# Patient Record
Sex: Female | Born: 1945
Health system: Southern US, Community
[De-identification: ages and names within clinical notes are randomized; demographics above are authoritative.]

## PROBLEM LIST (undated history)

## (undated) DIAGNOSIS — C539 Malignant neoplasm of cervix uteri, unspecified: Secondary | ICD-10-CM

## (undated) DIAGNOSIS — R55 Syncope and collapse: Secondary | ICD-10-CM

## (undated) DIAGNOSIS — E538 Deficiency of other specified B group vitamins: Secondary | ICD-10-CM

## (undated) DIAGNOSIS — D649 Anemia, unspecified: Secondary | ICD-10-CM

## (undated) DIAGNOSIS — Z8541 Personal history of malignant neoplasm of cervix uteri: Secondary | ICD-10-CM

## (undated) DIAGNOSIS — M5430 Sciatica, unspecified side: Secondary | ICD-10-CM

## (undated) DIAGNOSIS — J45991 Cough variant asthma: Secondary | ICD-10-CM

## (undated) DIAGNOSIS — B1910 Unspecified viral hepatitis B without hepatic coma: Secondary | ICD-10-CM

## (undated) DIAGNOSIS — M791 Myalgia, unspecified site: Secondary | ICD-10-CM

## (undated) DIAGNOSIS — E559 Vitamin D deficiency, unspecified: Secondary | ICD-10-CM

## (undated) DIAGNOSIS — IMO0002 Reserved for concepts with insufficient information to code with codable children: Secondary | ICD-10-CM

## (undated) HISTORY — DX: Reserved for concepts with insufficient information to code with codable children: IMO0002

## (undated) HISTORY — DX: Sciatica, unspecified side: M54.30

## (undated) HISTORY — DX: Anemia, unspecified: D64.9

## (undated) HISTORY — DX: Unspecified viral hepatitis B without hepatic coma: B19.10

## (undated) HISTORY — DX: Personal history of malignant neoplasm of cervix uteri: Z85.41

## (undated) HISTORY — PX: ABDOMINAL HYSTERECTOMY: SHX81

## (undated) HISTORY — DX: Vitamin D deficiency, unspecified: E55.9

## (undated) HISTORY — DX: Myalgia, unspecified site: M79.10

## (undated) HISTORY — DX: Malignant neoplasm of cervix uteri, unspecified: C53.9

## (undated) HISTORY — DX: Cough variant asthma: J45.991

## (undated) HISTORY — DX: Deficiency of other specified B group vitamins: E53.8

## (undated) HISTORY — DX: Syncope and collapse: R55

---

## 1977-06-03 DIAGNOSIS — Z8541 Personal history of malignant neoplasm of cervix uteri: Secondary | ICD-10-CM

## 1977-06-03 HISTORY — DX: Personal history of malignant neoplasm of cervix uteri: Z85.41

## 2000-01-04 ENCOUNTER — Emergency Department (HOSPITAL_COMMUNITY): Admission: EM | Admit: 2000-01-04 | Discharge: 2000-01-04 | Payer: Self-pay | Admitting: Emergency Medicine

## 2000-01-09 ENCOUNTER — Emergency Department (HOSPITAL_COMMUNITY): Admission: EM | Admit: 2000-01-09 | Discharge: 2000-01-09 | Payer: Self-pay | Admitting: Emergency Medicine

## 2000-01-10 ENCOUNTER — Encounter: Admission: RE | Admit: 2000-01-10 | Discharge: 2000-01-10 | Payer: Self-pay | Admitting: Internal Medicine

## 2000-01-10 ENCOUNTER — Ambulatory Visit (HOSPITAL_COMMUNITY): Admission: RE | Admit: 2000-01-10 | Discharge: 2000-01-10 | Payer: Self-pay | Admitting: Internal Medicine

## 2000-01-10 ENCOUNTER — Encounter: Payer: Self-pay | Admitting: Internal Medicine

## 2000-01-16 ENCOUNTER — Emergency Department (HOSPITAL_COMMUNITY): Admission: EM | Admit: 2000-01-16 | Discharge: 2000-01-16 | Payer: Self-pay | Admitting: Emergency Medicine

## 2000-01-17 ENCOUNTER — Ambulatory Visit (HOSPITAL_COMMUNITY): Admission: RE | Admit: 2000-01-17 | Discharge: 2000-01-17 | Payer: Self-pay | Admitting: Internal Medicine

## 2000-01-18 ENCOUNTER — Encounter: Admission: RE | Admit: 2000-01-18 | Discharge: 2000-01-18 | Payer: Self-pay | Admitting: Internal Medicine

## 2000-01-28 ENCOUNTER — Emergency Department (HOSPITAL_COMMUNITY): Admission: EM | Admit: 2000-01-28 | Discharge: 2000-01-28 | Payer: Self-pay

## 2000-02-05 ENCOUNTER — Emergency Department (HOSPITAL_COMMUNITY): Admission: EM | Admit: 2000-02-05 | Discharge: 2000-02-05 | Payer: Self-pay | Admitting: Emergency Medicine

## 2002-12-15 ENCOUNTER — Emergency Department (HOSPITAL_COMMUNITY): Admission: EM | Admit: 2002-12-15 | Discharge: 2002-12-15 | Payer: Self-pay | Admitting: Emergency Medicine

## 2003-02-24 ENCOUNTER — Emergency Department (HOSPITAL_COMMUNITY): Admission: EM | Admit: 2003-02-24 | Discharge: 2003-02-24 | Payer: Self-pay | Admitting: *Deleted

## 2003-03-03 ENCOUNTER — Emergency Department (HOSPITAL_COMMUNITY): Admission: EM | Admit: 2003-03-03 | Discharge: 2003-03-03 | Payer: Self-pay | Admitting: Emergency Medicine

## 2003-03-15 ENCOUNTER — Encounter: Admission: RE | Admit: 2003-03-15 | Discharge: 2003-04-18 | Payer: Self-pay

## 2003-03-17 ENCOUNTER — Encounter: Admission: RE | Admit: 2003-03-17 | Discharge: 2003-03-17 | Payer: Self-pay | Admitting: Internal Medicine

## 2003-03-30 ENCOUNTER — Emergency Department (HOSPITAL_COMMUNITY): Admission: EM | Admit: 2003-03-30 | Discharge: 2003-03-30 | Payer: Self-pay | Admitting: Emergency Medicine

## 2003-03-30 ENCOUNTER — Encounter: Admission: RE | Admit: 2003-03-30 | Discharge: 2003-03-30 | Payer: Self-pay | Admitting: Internal Medicine

## 2003-04-01 ENCOUNTER — Ambulatory Visit (HOSPITAL_COMMUNITY): Admission: RE | Admit: 2003-04-01 | Discharge: 2003-04-01 | Payer: Self-pay | Admitting: Internal Medicine

## 2003-05-09 ENCOUNTER — Ambulatory Visit (HOSPITAL_COMMUNITY): Admission: RE | Admit: 2003-05-09 | Discharge: 2003-05-09 | Payer: Self-pay | Admitting: Internal Medicine

## 2003-05-09 ENCOUNTER — Encounter: Admission: RE | Admit: 2003-05-09 | Discharge: 2003-05-09 | Payer: Self-pay | Admitting: Internal Medicine

## 2003-05-31 ENCOUNTER — Ambulatory Visit (HOSPITAL_COMMUNITY): Admission: RE | Admit: 2003-05-31 | Discharge: 2003-05-31 | Payer: Self-pay | Admitting: Internal Medicine

## 2003-05-31 ENCOUNTER — Encounter: Admission: RE | Admit: 2003-05-31 | Discharge: 2003-05-31 | Payer: Self-pay | Admitting: Internal Medicine

## 2003-06-04 DIAGNOSIS — R55 Syncope and collapse: Secondary | ICD-10-CM

## 2003-06-04 HISTORY — DX: Syncope and collapse: R55

## 2003-06-07 ENCOUNTER — Ambulatory Visit: Admission: RE | Admit: 2003-06-07 | Discharge: 2003-06-07 | Payer: Self-pay | Admitting: Internal Medicine

## 2003-06-07 ENCOUNTER — Encounter: Payer: Self-pay | Admitting: Cardiovascular Disease

## 2003-06-30 ENCOUNTER — Ambulatory Visit (HOSPITAL_COMMUNITY): Admission: RE | Admit: 2003-06-30 | Discharge: 2003-06-30 | Payer: Self-pay | Admitting: Internal Medicine

## 2003-08-05 ENCOUNTER — Encounter: Admission: RE | Admit: 2003-08-05 | Discharge: 2003-08-05 | Payer: Self-pay | Admitting: Internal Medicine

## 2003-08-10 ENCOUNTER — Ambulatory Visit (HOSPITAL_COMMUNITY): Admission: RE | Admit: 2003-08-10 | Discharge: 2003-08-10 | Payer: Self-pay | Admitting: Family Medicine

## 2003-12-30 ENCOUNTER — Encounter: Admission: RE | Admit: 2003-12-30 | Discharge: 2003-12-30 | Payer: Self-pay | Admitting: Internal Medicine

## 2004-06-13 ENCOUNTER — Ambulatory Visit: Payer: Self-pay | Admitting: Internal Medicine

## 2004-06-28 ENCOUNTER — Ambulatory Visit: Payer: Self-pay | Admitting: Internal Medicine

## 2004-07-10 ENCOUNTER — Ambulatory Visit (HOSPITAL_COMMUNITY): Admission: RE | Admit: 2004-07-10 | Discharge: 2004-07-10 | Payer: Self-pay | Admitting: Internal Medicine

## 2005-07-10 ENCOUNTER — Emergency Department (HOSPITAL_COMMUNITY): Admission: EM | Admit: 2005-07-10 | Discharge: 2005-07-10 | Payer: Self-pay | Admitting: Emergency Medicine

## 2005-08-06 ENCOUNTER — Ambulatory Visit: Payer: Self-pay | Admitting: Internal Medicine

## 2005-08-13 ENCOUNTER — Encounter (INDEPENDENT_AMBULATORY_CARE_PROVIDER_SITE_OTHER): Payer: Self-pay | Admitting: Hospitalist

## 2006-02-26 ENCOUNTER — Emergency Department (HOSPITAL_COMMUNITY): Admission: EM | Admit: 2006-02-26 | Discharge: 2006-02-26 | Payer: Self-pay | Admitting: Emergency Medicine

## 2006-03-18 ENCOUNTER — Emergency Department (HOSPITAL_COMMUNITY): Admission: EM | Admit: 2006-03-18 | Discharge: 2006-03-19 | Payer: Self-pay | Admitting: Emergency Medicine

## 2006-08-14 ENCOUNTER — Encounter (INDEPENDENT_AMBULATORY_CARE_PROVIDER_SITE_OTHER): Payer: Self-pay | Admitting: *Deleted

## 2006-08-14 ENCOUNTER — Ambulatory Visit: Payer: Self-pay | Admitting: Internal Medicine

## 2006-08-14 DIAGNOSIS — B1910 Unspecified viral hepatitis B without hepatic coma: Secondary | ICD-10-CM | POA: Insufficient documentation

## 2006-08-14 DIAGNOSIS — M1990 Unspecified osteoarthritis, unspecified site: Secondary | ICD-10-CM | POA: Insufficient documentation

## 2006-08-14 DIAGNOSIS — N390 Urinary tract infection, site not specified: Secondary | ICD-10-CM

## 2006-08-14 DIAGNOSIS — E785 Hyperlipidemia, unspecified: Secondary | ICD-10-CM

## 2006-08-14 LAB — CONVERTED CEMR LAB
Bilirubin Urine: NEGATIVE
Blood in Urine, dipstick: NEGATIVE
Glucose, Urine, Semiquant: NEGATIVE
HEP B PCR: <100 (ref <100)
Ketones, urine, test strip: NEGATIVE
Nitrite: NEGATIVE
Protein, U semiquant: NEGATIVE
Specific Gravity, Urine: 1.01
Urobilinogen, UA: 0.2
pH: 5.5

## 2006-08-18 LAB — CONVERTED CEMR LAB
ALT: 10 units/L (ref 0–35)
AST: 14 units/L (ref 0–37)
Albumin: 4.1 g/dL (ref 3.5–5.2)
Alkaline Phosphatase: 115 units/L (ref 39–117)
BUN: 14 mg/dL (ref 6–23)
Bilirubin Urine: NEGATIVE
CO2: 24 meq/L (ref 19–32)
Calcium: 9.3 mg/dL (ref 8.4–10.5)
Chloride: 106 meq/L (ref 96–112)
Creatinine, Ser: 0.94 mg/dL (ref 0.40–1.20)
Glucose, Bld: 117 mg/dL — ABNORMAL HIGH (ref 70–99)
Hemoglobin, Urine: NEGATIVE
Hep B Core Total Ab: POSITIVE — AB
Hep B E Ab: POSITIVE — AB
Hep B S Ab: POSITIVE — AB
Hepatitis B Surface Ag: NEGATIVE
Ketones, ur: NEGATIVE mg/dL
Nitrite: NEGATIVE
Potassium: 4.7 meq/L (ref 3.5–5.3)
Protein, ur: NEGATIVE mg/dL
RBC / HPF: NONE SEEN (ref <3)
Sodium: 140 meq/L (ref 135–145)
Specific Gravity, Urine: 1.01 (ref 1.005–1.03)
TSH: 1.145 microintl units/mL (ref 0.350–5.50)
Total Bilirubin: 0.4 mg/dL (ref 0.3–1.2)
Total Protein: 7.3 g/dL (ref 6.0–8.3)
Urine Glucose: NEGATIVE mg/dL
Urobilinogen, UA: 1 (ref 0.0–1.0)
WBC, UA: NONE SEEN cells/hpf (ref <3)
pH: 6 (ref 5.0–8.0)

## 2006-09-04 ENCOUNTER — Ambulatory Visit: Payer: Self-pay | Admitting: Internal Medicine

## 2006-09-04 ENCOUNTER — Encounter (INDEPENDENT_AMBULATORY_CARE_PROVIDER_SITE_OTHER): Payer: Self-pay | Admitting: Hospitalist

## 2006-09-04 DIAGNOSIS — M549 Dorsalgia, unspecified: Secondary | ICD-10-CM | POA: Insufficient documentation

## 2006-09-04 LAB — CONVERTED CEMR LAB
Bilirubin Urine: NEGATIVE
Ketones, ur: NEGATIVE mg/dL
Leukocytes, UA: NEGATIVE
Nitrite: NEGATIVE
Protein, ur: NEGATIVE mg/dL
Specific Gravity, Urine: 1.015 (ref 1.005–1.03)
Urine Glucose: NEGATIVE mg/dL
Urobilinogen, UA: 0.2 (ref 0.0–1.0)
pH: 5.5 (ref 5.0–8.0)

## 2006-09-05 LAB — CONVERTED CEMR LAB
Basophils Absolute: 0 10*3/uL (ref 0.0–0.1)
Basophils Relative: 0 % (ref 0–1)
Eosinophils Absolute: 0.1 10*3/uL (ref 0.0–0.7)
Eosinophils Relative: 2 % (ref 0–5)
HCT: 39.2 % (ref 36.0–46.0)
Hemoglobin: 12.9 g/dL (ref 12.0–15.0)
Lymphocytes Relative: 33 % (ref 12–46)
Lymphs Abs: 2.9 10*3/uL (ref 0.7–3.3)
MCHC: 32.9 g/dL (ref 30.0–36.0)
MCV: 93.1 fL (ref 78.0–100.0)
Monocytes Absolute: 0.9 10*3/uL — ABNORMAL HIGH (ref 0.2–0.7)
Monocytes Relative: 11 % (ref 3–11)
Neutro Abs: 4.7 10*3/uL (ref 1.7–7.7)
Neutrophils Relative %: 54 % (ref 43–77)
Platelets: 387 10*3/uL (ref 150–400)
RBC: 4.21 M/uL (ref 3.87–5.11)
RDW: 13.5 % (ref 11.5–14.0)
Sed Rate: 41 mm/hr — ABNORMAL HIGH (ref 0–22)
Vitamin B-12: 281 pg/mL (ref 211–911)
WBC: 8.6 10*3/uL (ref 4.0–10.5)

## 2006-09-18 ENCOUNTER — Encounter (INDEPENDENT_AMBULATORY_CARE_PROVIDER_SITE_OTHER): Payer: Self-pay | Admitting: Hospitalist

## 2006-09-18 ENCOUNTER — Ambulatory Visit: Payer: Self-pay | Admitting: Internal Medicine

## 2006-09-19 LAB — CONVERTED CEMR LAB
ANA Titer 1: 1:40 {titer} — ABNORMAL HIGH
Anti Nuclear Antibody(ANA): POSITIVE — AB
CRP: 0.3 mg/dL (ref <0.6)
Rheumatoid fact SerPl-aCnc: <20 intl units/mL (ref 0–20)
Total CK: 91 units/L (ref 7–177)

## 2006-09-22 DIAGNOSIS — E538 Deficiency of other specified B group vitamins: Secondary | ICD-10-CM

## 2006-09-22 HISTORY — DX: Deficiency of other specified B group vitamins: E53.8

## 2006-09-22 LAB — CONVERTED CEMR LAB: Homocysteine: 13.9 micromoles/L (ref 4.0–15.4)

## 2006-10-16 ENCOUNTER — Telehealth: Payer: Self-pay | Admitting: *Deleted

## 2006-10-16 ENCOUNTER — Ambulatory Visit: Payer: Self-pay | Admitting: Hospitalist

## 2006-10-16 DIAGNOSIS — J45991 Cough variant asthma: Secondary | ICD-10-CM | POA: Insufficient documentation

## 2006-10-16 HISTORY — DX: Cough variant asthma: J45.991

## 2006-10-17 ENCOUNTER — Encounter (INDEPENDENT_AMBULATORY_CARE_PROVIDER_SITE_OTHER): Payer: Self-pay | Admitting: Pulmonary Disease

## 2006-10-20 ENCOUNTER — Ambulatory Visit: Payer: Self-pay | Admitting: Internal Medicine

## 2006-10-21 ENCOUNTER — Ambulatory Visit: Payer: Self-pay | Admitting: Hospitalist

## 2006-10-22 ENCOUNTER — Ambulatory Visit: Payer: Self-pay | Admitting: Hospitalist

## 2006-10-23 ENCOUNTER — Ambulatory Visit: Payer: Self-pay | Admitting: Internal Medicine

## 2006-10-24 ENCOUNTER — Ambulatory Visit: Payer: Self-pay | Admitting: Hospitalist

## 2006-10-28 ENCOUNTER — Ambulatory Visit: Payer: Self-pay | Admitting: Internal Medicine

## 2006-10-28 ENCOUNTER — Encounter (INDEPENDENT_AMBULATORY_CARE_PROVIDER_SITE_OTHER): Payer: Self-pay | Admitting: Hospitalist

## 2006-10-28 LAB — CONVERTED CEMR LAB
BUN: 16 mg/dL (ref 6–23)
CO2: 24 meq/L (ref 19–32)
Calcium: 9.5 mg/dL (ref 8.4–10.5)
Chloride: 106 meq/L (ref 96–112)
Creatinine, Ser: 0.91 mg/dL (ref 0.40–1.20)
Glucose, Bld: 129 mg/dL — ABNORMAL HIGH (ref 70–99)
Potassium: 4.6 meq/L (ref 3.5–5.3)
Sodium: 141 meq/L (ref 135–145)

## 2006-11-05 ENCOUNTER — Ambulatory Visit: Payer: Self-pay | Admitting: Internal Medicine

## 2006-11-19 ENCOUNTER — Ambulatory Visit: Payer: Self-pay | Admitting: Internal Medicine

## 2006-11-19 ENCOUNTER — Encounter (INDEPENDENT_AMBULATORY_CARE_PROVIDER_SITE_OTHER): Payer: Self-pay | Admitting: *Deleted

## 2006-11-19 LAB — CONVERTED CEMR LAB
Bilirubin Urine: NEGATIVE
Bilirubin Urine: NEGATIVE
Blood in Urine, dipstick: NEGATIVE
Glucose, Urine, Semiquant: NEGATIVE
Hemoglobin, Urine: NEGATIVE
Ketones, ur: NEGATIVE mg/dL
Ketones, urine, test strip: NEGATIVE
Nitrite: NEGATIVE
Nitrite: NEGATIVE
Protein, U semiquant: NEGATIVE
Protein, ur: NEGATIVE mg/dL
RBC / HPF: NONE SEEN (ref <3)
Specific Gravity, Urine: 1.01
Specific Gravity, Urine: 1.008 (ref 1.005–1.03)
Urine Glucose: NEGATIVE mg/dL
Urobilinogen, UA: 0.2
Urobilinogen, UA: 0.2 (ref 0.0–1.0)
pH: 6
pH: 6 (ref 5.0–8.0)

## 2006-11-26 ENCOUNTER — Encounter: Payer: Self-pay | Admitting: Internal Medicine

## 2006-11-26 ENCOUNTER — Ambulatory Visit: Payer: Self-pay | Admitting: Internal Medicine

## 2006-11-26 ENCOUNTER — Encounter (INDEPENDENT_AMBULATORY_CARE_PROVIDER_SITE_OTHER): Payer: Self-pay | Admitting: Internal Medicine

## 2006-11-26 LAB — CONVERTED CEMR LAB
BUN: 14 mg/dL (ref 6–23)
Bilirubin Urine: NEGATIVE
CO2: 23 meq/L (ref 19–32)
Calcium: 9.6 mg/dL (ref 8.4–10.5)
Chloride: 105 meq/L (ref 96–112)
Creatinine, Ser: 1.07 mg/dL (ref 0.40–1.20)
Glucose, Bld: 101 mg/dL — ABNORMAL HIGH (ref 70–99)
Hemoglobin, Urine: NEGATIVE
Ketones, ur: NEGATIVE mg/dL
Nitrite: NEGATIVE
Potassium: 4.7 meq/L (ref 3.5–5.3)
Protein, ur: NEGATIVE mg/dL
RBC / HPF: NONE SEEN (ref <3)
Sodium: 141 meq/L (ref 135–145)
Specific Gravity, Urine: 1.016 (ref 1.005–1.03)
Urine Glucose: NEGATIVE mg/dL
Urobilinogen, UA: 0.2 (ref 0.0–1.0)
WBC, UA: NONE SEEN cells/hpf (ref <3)
pH: 5.5 (ref 5.0–8.0)

## 2006-12-11 ENCOUNTER — Ambulatory Visit: Payer: Self-pay | Admitting: Hospitalist

## 2006-12-11 DIAGNOSIS — G8190 Hemiplegia, unspecified affecting unspecified side: Secondary | ICD-10-CM | POA: Insufficient documentation

## 2006-12-11 DIAGNOSIS — F331 Major depressive disorder, recurrent, moderate: Secondary | ICD-10-CM

## 2006-12-11 LAB — CONVERTED CEMR LAB: Total CK: 66 units/L (ref 7–177)

## 2006-12-12 ENCOUNTER — Telehealth: Payer: Self-pay | Admitting: *Deleted

## 2006-12-12 ENCOUNTER — Encounter (INDEPENDENT_AMBULATORY_CARE_PROVIDER_SITE_OTHER): Payer: Self-pay | Admitting: Hospitalist

## 2007-01-13 ENCOUNTER — Ambulatory Visit (HOSPITAL_COMMUNITY): Admission: RE | Admit: 2007-01-13 | Discharge: 2007-01-13 | Payer: Self-pay | Admitting: Family Medicine

## 2007-01-14 ENCOUNTER — Encounter (INDEPENDENT_AMBULATORY_CARE_PROVIDER_SITE_OTHER): Payer: Self-pay | Admitting: Internal Medicine

## 2007-01-14 ENCOUNTER — Ambulatory Visit: Payer: Self-pay | Admitting: Infectious Disease

## 2007-01-14 LAB — CONVERTED CEMR LAB
Bilirubin Urine: NEGATIVE
Blood in Urine, dipstick: NEGATIVE
Glucose, Urine, Semiquant: NEGATIVE
Ketones, urine, test strip: NEGATIVE
Nitrite: NEGATIVE
Protein, U semiquant: NEGATIVE
Specific Gravity, Urine: 1.02
Urobilinogen, UA: 0.2
pH: 5.5

## 2007-02-25 ENCOUNTER — Ambulatory Visit: Payer: Self-pay | Admitting: Hospitalist

## 2007-04-13 ENCOUNTER — Telehealth: Payer: Self-pay | Admitting: *Deleted

## 2007-06-30 ENCOUNTER — Encounter (INDEPENDENT_AMBULATORY_CARE_PROVIDER_SITE_OTHER): Payer: Self-pay | Admitting: Infectious Diseases

## 2007-06-30 ENCOUNTER — Ambulatory Visit (HOSPITAL_COMMUNITY): Admission: RE | Admit: 2007-06-30 | Discharge: 2007-06-30 | Payer: Self-pay | Admitting: Internal Medicine

## 2007-06-30 ENCOUNTER — Ambulatory Visit: Payer: Self-pay | Admitting: Internal Medicine

## 2007-06-30 LAB — CONVERTED CEMR LAB
Bilirubin Urine: NEGATIVE
Blood in Urine, dipstick: NEGATIVE
Glucose, Urine, Semiquant: NEGATIVE
Ketones, urine, test strip: NEGATIVE
Nitrite: NEGATIVE
Protein, U semiquant: NEGATIVE
Specific Gravity, Urine: 5.5
Urobilinogen, UA: NEGATIVE
pH: 1.015

## 2007-07-01 LAB — CONVERTED CEMR LAB
ALT: 11 units/L (ref 0–35)
AST: 15 units/L (ref 0–37)
Albumin: 4.2 g/dL (ref 3.5–5.2)
Alkaline Phosphatase: 93 units/L (ref 39–117)
BUN: 13 mg/dL (ref 6–23)
Bacteria, UA: NONE SEEN
Bilirubin Urine: NEGATIVE
CO2: 25 meq/L (ref 19–32)
Calcium: 9.3 mg/dL (ref 8.4–10.5)
Chloride: 107 meq/L (ref 96–112)
Creatinine, Ser: 0.93 mg/dL (ref 0.40–1.20)
Glucose, Bld: 110 mg/dL — ABNORMAL HIGH (ref 70–99)
HCT: 35.4 % — ABNORMAL LOW (ref 36.0–46.0)
Hemoglobin, Urine: NEGATIVE
Hemoglobin: 11.9 g/dL — ABNORMAL LOW (ref 12.0–15.0)
Ketones, ur: NEGATIVE mg/dL
MCHC: 33.6 g/dL (ref 30.0–36.0)
MCV: 93.7 fL (ref 78.0–100.0)
Nitrite: NEGATIVE
Platelets: 331 10*3/uL (ref 150–400)
Potassium: 4 meq/L (ref 3.5–5.3)
Protein, ur: NEGATIVE mg/dL
RBC / HPF: NONE SEEN (ref <3)
RBC: 3.78 M/uL — ABNORMAL LOW (ref 3.87–5.11)
RDW: 13.2 % (ref 11.5–15.5)
Sodium: 142 meq/L (ref 135–145)
Specific Gravity, Urine: 1.012 (ref 1.005–1.03)
TSH: 1.707 microintl units/mL (ref 0.350–5.50)
Total Bilirubin: 0.4 mg/dL (ref 0.3–1.2)
Total Protein: 7.1 g/dL (ref 6.0–8.3)
Urine Glucose: NEGATIVE mg/dL
Urobilinogen, UA: 0.2 (ref 0.0–1.0)
WBC, UA: NONE SEEN cells/hpf (ref <3)
WBC: 8 10*3/uL (ref 4.0–10.5)
pH: 6 (ref 5.0–8.0)

## 2007-07-03 ENCOUNTER — Telehealth: Payer: Self-pay | Admitting: *Deleted

## 2007-07-07 ENCOUNTER — Ambulatory Visit: Payer: Self-pay | Admitting: Hospitalist

## 2007-08-10 ENCOUNTER — Ambulatory Visit: Payer: Self-pay | Admitting: Infectious Diseases

## 2007-08-24 ENCOUNTER — Ambulatory Visit: Payer: Self-pay | Admitting: Hospitalist

## 2007-08-24 DIAGNOSIS — J329 Chronic sinusitis, unspecified: Secondary | ICD-10-CM | POA: Insufficient documentation

## 2007-10-22 ENCOUNTER — Ambulatory Visit: Payer: Self-pay | Admitting: Hospitalist

## 2007-10-22 DIAGNOSIS — K5289 Other specified noninfective gastroenteritis and colitis: Secondary | ICD-10-CM | POA: Insufficient documentation

## 2007-11-23 ENCOUNTER — Encounter (INDEPENDENT_AMBULATORY_CARE_PROVIDER_SITE_OTHER): Payer: Self-pay | Admitting: Hospitalist

## 2007-11-24 ENCOUNTER — Ambulatory Visit: Payer: Self-pay | Admitting: Internal Medicine

## 2007-12-11 ENCOUNTER — Encounter: Admission: RE | Admit: 2007-12-11 | Discharge: 2008-01-07 | Payer: Self-pay | Admitting: Hospitalist

## 2007-12-23 ENCOUNTER — Encounter (INDEPENDENT_AMBULATORY_CARE_PROVIDER_SITE_OTHER): Payer: Self-pay | Admitting: Internal Medicine

## 2007-12-23 ENCOUNTER — Ambulatory Visit: Payer: Self-pay | Admitting: Internal Medicine

## 2008-01-07 ENCOUNTER — Encounter (INDEPENDENT_AMBULATORY_CARE_PROVIDER_SITE_OTHER): Payer: Self-pay | Admitting: Internal Medicine

## 2008-01-13 ENCOUNTER — Ambulatory Visit: Payer: Self-pay | Admitting: Internal Medicine

## 2008-01-14 DIAGNOSIS — D649 Anemia, unspecified: Secondary | ICD-10-CM | POA: Insufficient documentation

## 2008-01-14 HISTORY — DX: Anemia, unspecified: D64.9

## 2008-01-14 LAB — CONVERTED CEMR LAB
ALT: 8 units/L (ref 0–35)
AST: 12 units/L (ref 0–37)
Albumin: 4.1 g/dL (ref 3.5–5.2)
Alkaline Phosphatase: 98 units/L (ref 39–117)
BUN: 13 mg/dL (ref 6–23)
CO2: 22 meq/L (ref 19–32)
Calcium: 8.9 mg/dL (ref 8.4–10.5)
Chloride: 107 meq/L (ref 96–112)
Creatinine, Ser: 0.98 mg/dL (ref 0.40–1.20)
Glucose, Bld: 107 mg/dL — ABNORMAL HIGH (ref 70–99)
HCT: 35.2 % — ABNORMAL LOW (ref 36.0–46.0)
Hemoglobin: 11.8 g/dL — ABNORMAL LOW (ref 12.0–15.0)
MCHC: 33.5 g/dL (ref 30.0–36.0)
MCV: 94.4 fL (ref 78.0–100.0)
Platelets: 299 10*3/uL (ref 150–400)
Potassium: 4.2 meq/L (ref 3.5–5.3)
RBC: 3.73 M/uL — ABNORMAL LOW (ref 3.87–5.11)
RDW: 13.5 % (ref 11.5–15.5)
Sodium: 140 meq/L (ref 135–145)
Total Bilirubin: 0.4 mg/dL (ref 0.3–1.2)
Total Protein: 7 g/dL (ref 6.0–8.3)
Vitamin B-12: 439 pg/mL (ref 211–911)
WBC: 7.3 10*3/uL (ref 4.0–10.5)

## 2008-01-18 ENCOUNTER — Encounter: Admission: RE | Admit: 2008-01-18 | Discharge: 2008-02-18 | Payer: Self-pay | Admitting: Internal Medicine

## 2008-01-18 ENCOUNTER — Telehealth (INDEPENDENT_AMBULATORY_CARE_PROVIDER_SITE_OTHER): Payer: Self-pay | Admitting: Internal Medicine

## 2008-01-27 ENCOUNTER — Encounter (INDEPENDENT_AMBULATORY_CARE_PROVIDER_SITE_OTHER): Payer: Self-pay | Admitting: Internal Medicine

## 2008-01-28 ENCOUNTER — Ambulatory Visit: Payer: Self-pay | Admitting: Internal Medicine

## 2008-02-03 ENCOUNTER — Encounter (INDEPENDENT_AMBULATORY_CARE_PROVIDER_SITE_OTHER): Payer: Self-pay | Admitting: Internal Medicine

## 2008-02-17 ENCOUNTER — Encounter (INDEPENDENT_AMBULATORY_CARE_PROVIDER_SITE_OTHER): Payer: Self-pay | Admitting: Internal Medicine

## 2008-04-19 ENCOUNTER — Ambulatory Visit: Payer: Self-pay | Admitting: Infectious Diseases

## 2008-05-09 ENCOUNTER — Ambulatory Visit: Payer: Self-pay | Admitting: Internal Medicine

## 2008-05-10 ENCOUNTER — Encounter: Admission: RE | Admit: 2008-05-10 | Discharge: 2008-06-02 | Payer: Self-pay | Admitting: Internal Medicine

## 2008-05-10 ENCOUNTER — Encounter (INDEPENDENT_AMBULATORY_CARE_PROVIDER_SITE_OTHER): Payer: Self-pay | Admitting: Internal Medicine

## 2008-05-19 ENCOUNTER — Ambulatory Visit: Payer: Self-pay | Admitting: Internal Medicine

## 2008-05-19 ENCOUNTER — Encounter (INDEPENDENT_AMBULATORY_CARE_PROVIDER_SITE_OTHER): Payer: Self-pay | Admitting: Internal Medicine

## 2008-05-19 LAB — CONVERTED CEMR LAB
Cholesterol: 156 mg/dL (ref 0–200)
HDL: 46 mg/dL (ref >39)
LDL Cholesterol: 96 mg/dL (ref 0–99)
Total CHOL/HDL Ratio: 3.4
Triglycerides: 68 mg/dL (ref <150)
VLDL: 14 mg/dL (ref 0–40)

## 2008-06-07 ENCOUNTER — Ambulatory Visit: Payer: Self-pay | Admitting: Internal Medicine

## 2008-06-07 ENCOUNTER — Encounter: Admission: RE | Admit: 2008-06-07 | Discharge: 2008-09-05 | Payer: Self-pay | Admitting: Internal Medicine

## 2008-06-23 ENCOUNTER — Encounter (INDEPENDENT_AMBULATORY_CARE_PROVIDER_SITE_OTHER): Payer: Self-pay | Admitting: Internal Medicine

## 2008-06-23 ENCOUNTER — Encounter: Payer: Self-pay | Admitting: Internal Medicine

## 2008-06-23 ENCOUNTER — Ambulatory Visit: Payer: Self-pay | Admitting: Infectious Disease

## 2008-06-27 DIAGNOSIS — E559 Vitamin D deficiency, unspecified: Secondary | ICD-10-CM

## 2008-06-27 HISTORY — DX: Vitamin D deficiency, unspecified: E55.9

## 2008-06-29 ENCOUNTER — Ambulatory Visit (HOSPITAL_COMMUNITY): Admission: RE | Admit: 2008-06-29 | Discharge: 2008-06-29 | Payer: Self-pay | Admitting: Internal Medicine

## 2008-06-29 LAB — CONVERTED CEMR LAB
ALT: 10 units/L (ref 0–35)
AST: 14 units/L (ref 0–37)
Albumin: 4 g/dL (ref 3.5–5.2)
Alkaline Phosphatase: 100 units/L (ref 39–117)
BUN: 21 mg/dL (ref 6–23)
Basophils Absolute: 0 10*3/uL (ref 0.0–0.1)
Basophils Relative: 0 % (ref 0–1)
Bilirubin Urine: NEGATIVE
CO2: 22 meq/L (ref 19–32)
Calcium: 9.2 mg/dL (ref 8.4–10.5)
Chloride: 107 meq/L (ref 96–112)
Creatinine, Ser: 0.88 mg/dL (ref 0.40–1.20)
Eosinophils Absolute: 0.2 10*3/uL (ref 0.0–0.7)
Eosinophils Relative: 2 % (ref 0–5)
Glucose, Bld: 119 mg/dL — ABNORMAL HIGH (ref 70–99)
HCT: 35.9 % — ABNORMAL LOW (ref 36.0–46.0)
Hemoglobin, Urine: NEGATIVE
Hemoglobin: 11.4 g/dL — ABNORMAL LOW (ref 12.0–15.0)
Ketones, ur: NEGATIVE mg/dL
Leukocytes, UA: NEGATIVE
Lymphocytes Relative: 24 % (ref 12–46)
Lymphs Abs: 2.2 10*3/uL (ref 0.7–4.0)
MCHC: 31.8 g/dL (ref 30.0–36.0)
MCV: 97 fL (ref 78.0–100.0)
Monocytes Absolute: 0.9 10*3/uL (ref 0.1–1.0)
Monocytes Relative: 10 % (ref 3–12)
Neutro Abs: 5.7 10*3/uL (ref 1.7–7.7)
Neutrophils Relative %: 64 % (ref 43–77)
Nitrite: NEGATIVE
Platelets: 339 10*3/uL (ref 150–400)
Potassium: 4.4 meq/L (ref 3.5–5.3)
Protein, ur: NEGATIVE mg/dL
RBC: 3.7 M/uL — ABNORMAL LOW (ref 3.87–5.11)
RDW: 13.6 % (ref 11.5–15.5)
Sodium: 142 meq/L (ref 135–145)
Specific Gravity, Urine: 1.022 (ref 1.005–1.03)
TSH: 1.237 microintl units/mL (ref 0.350–4.50)
Total Bilirubin: 0.2 mg/dL — ABNORMAL LOW (ref 0.3–1.2)
Total Protein: 7.4 g/dL (ref 6.0–8.3)
Urine Glucose: NEGATIVE mg/dL
Urobilinogen, UA: 1 (ref 0.0–1.0)
Vit D, 1,25-Dihydroxy: 10 — ABNORMAL LOW (ref 30–89)
Vitamin B-12: 429 pg/mL (ref 211–911)
WBC: 8.9 10*3/uL (ref 4.0–10.5)
pH: 6 (ref 5.0–8.0)

## 2008-06-30 ENCOUNTER — Encounter (INDEPENDENT_AMBULATORY_CARE_PROVIDER_SITE_OTHER): Payer: Self-pay | Admitting: Internal Medicine

## 2008-07-27 ENCOUNTER — Ambulatory Visit: Payer: Self-pay | Admitting: Internal Medicine

## 2008-08-09 ENCOUNTER — Ambulatory Visit: Payer: Self-pay | Admitting: Internal Medicine

## 2008-08-26 ENCOUNTER — Telehealth: Payer: Self-pay | Admitting: *Deleted

## 2008-09-05 ENCOUNTER — Encounter (INDEPENDENT_AMBULATORY_CARE_PROVIDER_SITE_OTHER): Payer: Self-pay | Admitting: Internal Medicine

## 2008-09-15 ENCOUNTER — Encounter: Admission: RE | Admit: 2008-09-15 | Discharge: 2008-11-08 | Payer: Self-pay | Admitting: Internal Medicine

## 2008-09-15 ENCOUNTER — Ambulatory Visit: Payer: Self-pay | Admitting: Internal Medicine

## 2008-09-19 ENCOUNTER — Telehealth: Payer: Self-pay

## 2008-10-12 ENCOUNTER — Ambulatory Visit: Payer: Self-pay | Admitting: Internal Medicine

## 2008-10-13 ENCOUNTER — Encounter (INDEPENDENT_AMBULATORY_CARE_PROVIDER_SITE_OTHER): Payer: Self-pay | Admitting: Internal Medicine

## 2008-11-17 ENCOUNTER — Encounter (INDEPENDENT_AMBULATORY_CARE_PROVIDER_SITE_OTHER): Payer: Self-pay | Admitting: Internal Medicine

## 2008-11-28 ENCOUNTER — Encounter (INDEPENDENT_AMBULATORY_CARE_PROVIDER_SITE_OTHER): Payer: Self-pay | Admitting: Internal Medicine

## 2009-01-10 ENCOUNTER — Telehealth (INDEPENDENT_AMBULATORY_CARE_PROVIDER_SITE_OTHER): Payer: Self-pay | Admitting: Internal Medicine

## 2009-01-18 ENCOUNTER — Ambulatory Visit: Payer: Self-pay | Admitting: Internal Medicine

## 2009-02-15 ENCOUNTER — Encounter: Admission: RE | Admit: 2009-02-15 | Discharge: 2009-04-21 | Payer: Self-pay | Admitting: Internal Medicine

## 2009-02-23 ENCOUNTER — Encounter: Payer: Self-pay | Admitting: Internal Medicine

## 2009-03-06 ENCOUNTER — Ambulatory Visit: Payer: Self-pay | Admitting: Internal Medicine

## 2009-03-06 LAB — CONVERTED CEMR LAB
Basophils Relative: 0 % (ref 0–1)
CO2: 24 meq/L (ref 19–32)
Cholesterol: 177 mg/dL (ref 0–200)
Eosinophils Absolute: 0.1 10*3/uL (ref 0.0–0.7)
Glucose, Bld: 108 mg/dL — ABNORMAL HIGH (ref 70–99)
HCT: 38.9 % (ref 36.0–46.0)
Hemoglobin: 12.5 g/dL (ref 12.0–15.0)
MCHC: 32.1 g/dL (ref 30.0–36.0)
MCV: 94.2 fL (ref >=78.0)
Monocytes Absolute: 0.9 10*3/uL (ref 0.1–1.0)
Monocytes Relative: 10 % (ref 3–12)
Neutro Abs: 5.4 10*3/uL (ref 1.7–7.7)
RBC: 4.13 M/uL (ref 3.87–5.11)
Sodium: 141 meq/L (ref 135–145)
Total Bilirubin: 0.5 mg/dL (ref 0.3–1.2)
Total Protein: 7.5 g/dL (ref 6.0–8.3)
Triglycerides: 116 mg/dL (ref <150)
VLDL: 23 mg/dL (ref 0–40)
Vit D, 25-Hydroxy: 17 ng/mL — ABNORMAL LOW (ref 30–89)

## 2009-03-09 ENCOUNTER — Ambulatory Visit: Payer: Self-pay | Admitting: Surgery

## 2009-03-09 ENCOUNTER — Ambulatory Visit: Admission: RE | Admit: 2009-03-09 | Discharge: 2009-03-09 | Payer: Self-pay | Admitting: Internal Medicine

## 2009-03-09 ENCOUNTER — Encounter: Payer: Self-pay | Admitting: Internal Medicine

## 2009-04-04 ENCOUNTER — Encounter: Payer: Self-pay | Admitting: Internal Medicine

## 2009-04-21 ENCOUNTER — Encounter: Payer: Self-pay | Admitting: Internal Medicine

## 2009-05-01 ENCOUNTER — Emergency Department (HOSPITAL_BASED_OUTPATIENT_CLINIC_OR_DEPARTMENT_OTHER): Admission: EM | Admit: 2009-05-01 | Discharge: 2009-05-01 | Payer: Self-pay | Admitting: Emergency Medicine

## 2009-08-01 ENCOUNTER — Ambulatory Visit: Payer: Self-pay | Admitting: Internal Medicine

## 2009-08-01 LAB — CONVERTED CEMR LAB
BUN: 13 mg/dL (ref 6–23)
CRP: 0.3 mg/dL (ref <0.6)
Chloride: 106 meq/L (ref 96–112)
Magnesium: 1.9 mg/dL (ref 1.5–2.5)
Potassium: 4.8 meq/L (ref 3.5–5.3)
Sed Rate: 56 mm/hr — ABNORMAL HIGH (ref 0–22)
Sodium: 141 meq/L (ref 135–145)
Vitamin B-12: 387 pg/mL (ref 211–911)

## 2009-08-03 ENCOUNTER — Encounter: Payer: Self-pay | Admitting: Internal Medicine

## 2009-08-08 ENCOUNTER — Emergency Department (HOSPITAL_BASED_OUTPATIENT_CLINIC_OR_DEPARTMENT_OTHER): Admission: EM | Admit: 2009-08-08 | Discharge: 2009-08-08 | Payer: Self-pay | Admitting: Emergency Medicine

## 2009-08-09 LAB — CONVERTED CEMR LAB
Anti Nuclear Antibody(ANA): POSITIVE — AB
Rhuematoid fact SerPl-aCnc: <20 intl units/mL (ref 0–20)

## 2009-09-08 ENCOUNTER — Telehealth: Payer: Self-pay | Admitting: *Deleted

## 2009-10-20 ENCOUNTER — Ambulatory Visit: Payer: Self-pay | Admitting: Infectious Disease

## 2009-11-02 ENCOUNTER — Ambulatory Visit: Payer: Self-pay | Admitting: Internal Medicine

## 2009-11-03 ENCOUNTER — Telehealth: Payer: Self-pay | Admitting: Internal Medicine

## 2009-11-21 ENCOUNTER — Ambulatory Visit: Payer: Self-pay | Admitting: Internal Medicine

## 2009-12-22 ENCOUNTER — Ambulatory Visit: Payer: Self-pay | Admitting: Internal Medicine

## 2010-01-24 ENCOUNTER — Ambulatory Visit: Payer: Self-pay | Admitting: Internal Medicine

## 2010-03-07 ENCOUNTER — Ambulatory Visit: Payer: Self-pay | Admitting: Internal Medicine

## 2010-05-03 ENCOUNTER — Ambulatory Visit: Payer: Self-pay | Admitting: Internal Medicine

## 2010-05-08 ENCOUNTER — Ambulatory Visit: Payer: Self-pay | Admitting: Internal Medicine

## 2010-05-08 ENCOUNTER — Encounter: Payer: Self-pay | Admitting: Internal Medicine

## 2010-05-08 LAB — CONVERTED CEMR LAB
Blood in Urine, dipstick: NEGATIVE
Ketones, urine, test strip: NEGATIVE
Nitrite: NEGATIVE

## 2010-05-09 ENCOUNTER — Encounter: Payer: Self-pay | Admitting: Internal Medicine

## 2010-05-09 LAB — CONVERTED CEMR LAB: Hgb A1c MFr Bld: 5.9 % — ABNORMAL HIGH (ref <5.7)

## 2010-05-31 LAB — CONVERTED CEMR LAB
BUN: 12 mg/dL (ref 6–23)
Basophils Absolute: 0 10*3/uL (ref 0.0–0.1)
Basophils Relative: 1 % (ref 0–1)
Bilirubin Urine: NEGATIVE
Calcium: 9.2 mg/dL (ref 8.4–10.5)
Chloride: 104 meq/L (ref 96–112)
Creatinine, Ser: 0.85 mg/dL (ref 0.40–1.20)
MCHC: 32 g/dL (ref 30.0–36.0)
Monocytes Relative: 12 % (ref 3–12)
Neutro Abs: 3.5 10*3/uL (ref 1.7–7.7)
Neutrophils Relative %: 57 % (ref 43–77)
Nitrite: NEGATIVE
RBC: 3.51 M/uL — ABNORMAL LOW (ref 3.87–5.11)
Specific Gravity, Urine: 1.018 (ref 1.005–1.0)
Urobilinogen, UA: 0.2 (ref 0.0–1.0)

## 2010-06-07 ENCOUNTER — Ambulatory Visit: Admit: 2010-06-07 | Payer: Self-pay

## 2010-06-11 ENCOUNTER — Ambulatory Visit: Admission: RE | Admit: 2010-06-11 | Discharge: 2010-06-11 | Payer: Self-pay | Source: Home / Self Care

## 2010-06-11 LAB — CONVERTED CEMR LAB
Bilirubin Urine: NEGATIVE
Specific Gravity, Urine: 1.01
Urobilinogen, UA: 0.2
pH: 5.5

## 2010-06-23 ENCOUNTER — Encounter: Payer: Self-pay | Admitting: Internal Medicine

## 2010-06-24 ENCOUNTER — Encounter: Payer: Self-pay | Admitting: Neurology

## 2010-06-24 ENCOUNTER — Encounter: Payer: Self-pay | Admitting: Internal Medicine

## 2010-06-25 ENCOUNTER — Encounter: Payer: Self-pay | Admitting: Internal Medicine
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
## 2010-07-03 NOTE — Assessment & Plan Note (Signed)
Summary: B12 INJ/CH  Nurse Visit   Allergies: 1)  ! Sulfa 2)  ! Macrobid (Nitrofurantoin Monohyd Macro)  Medication Administration  Injection # 1:    Medication: Vit B12 1000 mcg    Diagnosis: B12 DEFICIENCY (ICD-266.2)    Route: IM    Site: L deltoid    Exp Date: 09/2011    Lot #: [id number]    Mfr: [employer]    Patient tolerated injection without complications    Given by: Angelina Ok RN (November 21, 2009 1:37 PM)  Orders Added: 1)  Admin of Therapeutic Inj  intramuscular or subcutaneous [96372] 2)  Vit B12 1000 mcg [J3420]    Medication Administration  Injection # 1:    Medication: Vit B12 1000 mcg    Diagnosis: B12 DEFICIENCY (ICD-266.2)    Route: IM    Site: L deltoid    Exp Date: 09/2011    Lot #: [id number]    Mfr: [employer]    Patient tolerated injection without complications    Given by: Angelina Ok RN (November 21, 2009 1:37 PM)  Orders Added: 1)  Admin of Therapeutic Inj  intramuscular or subcutaneous [96372] 2)  Vit B12 1000 mcg [J3420]

## 2010-07-03 NOTE — Assessment & Plan Note (Signed)
Summary: Rachel Murray/NOT FEELING WELL/CH   Vital Signs:  Patient profile:   65 year old female Height:      68.5 inches (173.99 cm) Weight:      162.9 pounds (74.05 kg) BMI:     24.50 Temp:     97.7 degrees F (36.50 degrees C) oral Pulse rate:   78 / minute BP sitting:   129 / 79  (left arm)  Vitals Entered By: Stanton Kidney Ditzler RN (May 08, 2010 9:44 AM) Is Patient Diabetic? No Pain Assessment Patient in pain? yes     Location: left back Intensity: 7 Type: sharp Onset of pain  2 weeks Nutritional Status BMI of 19 -24 = normal Nutritional Status Detail appetite  fair  Have you ever been in a relationship where you felt threatened, hurt or afraid?denies   Does patient need assistance? Functional Status Self care Ambulation Impaired:Risk for fall Comments Uses a cane. Ck-up. Past 2 months thirsty and tired. Past 2 weeks sharp pain left back area - pt states kidney. Has doubled up on pain med with no relief.   Primary Care Provider:  Ned Grace MD   History of Present Illness: she fell about 2 weeks ago when she was getting out shower so she went to the ER in [facility].  She was given a prescription of Nucynta 50mg  but Medicaid would not pay for her prescription so she has been taking 2 Tramadol and 3 extra strength.  She has a history of kidney stones on the left side, no surgical intervention and she passed it on her own.  She does not think that this is muscle spasm.  Pressure on left side of kidney with stabbing pain, denies any burning sensation or dysuria, or fever.  Increae in frequency 6x per day, + nocturia.  She also reports pain on epigastric region and it is relieved with peanut, she believes that she has diabetes.   She has always been thirsty for years.  Denies any family history of DM. [phone]    Depression History:      The patient denies a depressed mood most of the day and a diminished interest in her usual daily activities.          Preventive Screening-Counseling & Management  Alcohol-Tobacco     Smoking Status: never     Passive Smoke Exposure: no  Caffeine-Diet-Exercise     Does Patient Exercise: yes     Type of exercise: PT     Times/week: 2  Allergies: 1)  ! Sulfa 2)  ! Macrobid (Nitrofurantoin Monohyd Macro)  Review of Systems  The patient denies anorexia, fever, weight loss, weight gain, vision loss, decreased hearing, hoarseness, chest pain, syncope, dyspnea on exertion, peripheral edema, prolonged cough, headaches, hemoptysis, abdominal pain, melena, hematochezia, severe indigestion/heartburn, hematuria, incontinence, genital sores, muscle weakness, suspicious skin lesions, transient blindness, difficulty walking, depression, unusual weight change, abnormal bleeding, enlarged lymph nodes, angioedema, breast masses, and testicular masses.    Physical Exam  General:  alert, well-developed, well-nourished, and well-hydrated.   Lungs:  normal respiratory effort, no intercostal retractions, no accessory muscle use, normal breath sounds, no crackles, and no wheezes.   Heart:  normal rate, regular rhythm, no murmur, no gallop, no rub, and no JVD.   Abdomen:  soft, non-tender, normal bowel sounds, no distention, and no masses.  + left CVA tenderness.  Pulses:  R and L carotid,radial,femoral,dorsalis pedis and posterior tibial pulses are full and equal bilaterally Extremities:  No  clubbing, cyanosis, edema, or deformity noted with normal full range of motion of all joints.   Neurologic:  alert & oriented X3 and cranial nerves II-XII intact.     Impression & Recommendations:  Problem # 1:  FLANK PAIN, LEFT (ICD-789.09) Differential dx: UTI, pyelonephritis, hydronephrosis, muscle spasm, or kidney stones.  She does have a positive CVA on left side of flank and increase in urinary frequency and nocturia.  She has been afebrile, denies any N/V  Will send for UA/ urine culture Will check CBC,BMET today  Her  updated medication list for this problem includes:    Aspir-low 81 Mg Tbec (Aspirin) .Marland Kitchen... Take 1 tablet by mouth once a day    Flexeril 5 Mg Tabs (Cyclobenzaprine hcl) .Marland Kitchen... Take one pill at night before bedtime    Tramadol Hcl 50 Mg Tabs (Tramadol hcl) .Marland Kitchen... Take 1 tablet by mouth twice a day as needed for pain  Orders: T-Basic Metabolic Panel [id number]) T-CBC w/Diff [id number]) T-Urinalysis ([id number]) T-Culture, Urine ([id number])  Problem # 2:  MYALGIA (ICD-729.1) She still complains of pain all over her body especially left flank pain. Will refill her Tramadol 50mg  1 tablet by mouth two times a day as needed for pain. She will follow up with Dr. Onalee Hua in 4 weeks.  Her updated medication list for this problem includes:    Aspir-low 81 Mg Tbec (Aspirin) .Marland Kitchen... Take 1 tablet by mouth once a day    Flexeril 5 Mg Tabs (Cyclobenzaprine hcl) .Marland Kitchen... Take one pill at night before bedtime    Tramadol Hcl 50 Mg Tabs (Tramadol hcl) .Marland Kitchen... Take 1 tablet by mouth twice a day as needed for pain  Orders: T-Basic Metabolic Panel [id number]) T-CBC w/Diff [phone])  Problem # 3:  FOOT PAIN, BILATERAL (ICD-729.5) Continue Tramadol 50mg  by mouth two times a day as needed for pain.  Complete Medication List: 1)  Aspir-low 81 Mg Tbec (Aspirin) .... Take 1 tablet by mouth once a day 2)  Cyanocobalamin 1000 Mcg/ml Soln (Cyanocobalamin) .... Im monthly 3)  Flexeril 5 Mg Tabs (Cyclobenzaprine hcl) .... Take one pill at night before bedtime 4)  Albuterol 90 Mcg/act Aers (Albuterol) .Marland Kitchen.. 1-2 puffs every 4 hours as needed for shortness of breath and wheezing 5)  Claritin-d 24 Hour 10-240 Mg Tb24 (Loratadine-pseudoephedrine) .... Take one tablet daily 6)  Tramadol Hcl 50 Mg Tabs (Tramadol hcl) .... Take 1 tablet by mouth twice a day as needed for pain 7)  Vitamin D 400 Unit Caps (Cholecalciferol) .... Take 2 tablets by mouth daily  Patient Instructions: 1)  Will get labwork today and I will  call you with lab results 2)  You can take Tramadol up to 2 tablets daily as needed for pain 3)  Please follow up with Dr. Onalee Hua in 3-4 weeks Prescriptions: TRAMADOL HCL 50 MG TABS (TRAMADOL HCL) Take 1 tablet by mouth twice a day as needed for pain  #60 x 3   Entered and Authorized by:   Rosana Berger MD   Signed by:   Rosana Berger MD on 05/08/2010   Method used:   Electronically to        [facility] [phone]* (retail)       [address] [place], [place]  [address]       Ph: [phone]       Fax: [phone]   RxID:   [id number]    Orders Added: 1)  Est. Patient Level  III [id number] 2)  T-Basic Metabolic Panel [[id number]] 3)  T-CBC w/Diff [[id number]] 4)  T-Urinalysis [81003-65000] 5)  T-Culture, Urine [[id number]]    Prevention & Chronic Care Immunizations   Influenza vaccine: Not documented   Influenza vaccine deferral: Refused  (08/01/2009)    Tetanus booster: Not documented   Td booster deferral: Refused  (08/01/2009)    Pneumococcal vaccine: Not documented    H. zoster vaccine: Not documented   H. zoster vaccine deferral: Deferred  (08/01/2009)  Colorectal Screening   Hemoccult: Not documented   Hemoccult action/deferral: Ordered  (01/24/2010)    Colonoscopy: Not documented   Colonoscopy action/deferral: Refused  (01/18/2009)  Other Screening   Pap smear: Not documented   Pap smear action/deferral: Not indicated S/P hysterectomy  (01/24/2010)    Mammogram: No specific mammographic evidence of malignancy.  Assessment: BIRADS 1. Location: [facility].     (01/13/2007)   Mammogram action/deferral: Ordered  (01/24/2010)   Mammogram due: 01/2008    DXA bone density scan: Not documented   Smoking status: never  (05/08/2010)  Lipids   Total Cholesterol: 177  (03/06/2009)   Lipid panel action/deferral: Lipid Panel ordered   LDL: 101  (03/06/2009)   LDL Direct: Not documented   HDL: 53  (03/06/2009)   Triglycerides:  116  (03/06/2009)    SGOT (AST): 14  (03/06/2009)   BMP action: Ordered   SGPT (ALT): 11  (03/06/2009)   Alkaline phosphatase: 102  (03/06/2009)   Total bilirubin: 0.5  (03/06/2009)  Self-Management Support :   Personal Goals (by the next clinic visit) :      Personal LDL goal: 130  (03/06/2009)    Patient will work on the following items until the next clinic visit to reach self-care goals:     Medications and monitoring: take my medicines every day, bring all of my medications to every visit, weigh myself weekly  (05/08/2010)     Eating: eat more vegetables, use fresh or frozen vegetables, eat baked foods instead of fried foods, eat fruit for snacks and desserts, limit or avoid alcohol  (05/08/2010)     Activity: take a 30 minute walk every day, park at the far end of the parking lot  (05/08/2010)     Other: PT  (03/06/2009)    Lipid self-management support: Written self-care plan, Education handout, Resources for patients handout  (05/08/2010)   Lipid self-care plan printed.   Lipid education handout printed      Resource handout printed.   Laboratory Results   Urine Tests  Date/Time Received: 05/08/10  9:46AM Date/Time Reported: same  Routine Urinalysis   Color: yellow Appearance: Clear Glucose: negative   (Normal Range: Negative) Bilirubin: negative   (Normal Range: Negative) Ketone: negative   (Normal Range: Negative) Spec. Gravity: 1.020   (Normal Range: 1.003-1.035) Blood: negative   (Normal Range: Negative) pH: 5.0   (Normal Range: 5.0-8.0) Protein: negative   (Normal Range: Negative) Urobilinogen: 0.2   (Normal Range: 0-1) Nitrite: negative   (Normal Range: Negative) Leukocyte Esterace: negative   (Normal Range: Negative)        Appended Document: Orders Update Add HGB A1C    Clinical Lists Changes  Orders: Added new Test order of T- Hemoglobin A1C ([id number]) - Signed      Process Orders Check Orders Results:     [facility]: ABN not required for this insurance Order queued for requisitioning for [facility]: May 09, 2010 12:20 PM Tests Sent for requisitioning (May 09, 2010 12:25 PM):     05/09/2010: [facility] -- T- Hemoglobin A1C [[id number]] (signed)

## 2010-07-03 NOTE — Assessment & Plan Note (Signed)
Summary: B12 SHOT/CH  Nurse Visit   Allergies: 1)  ! Sulfa 2)  ! Macrobid (Nitrofurantoin Monohyd Macro)  Medication Administration  Injection # 1:    Medication: Vit B12 1000 mcg    Diagnosis: BACK PAIN (ICD-724.5)    Route: IM    Site: R deltoid    Exp Date: 12/2011    Lot #: [id number]    Mfr: [employer]    Patient tolerated injection without complications    Given by: Angelina Ok RN (May 03, 2010 3:01 PM)  Orders Added: 1)  Admin of Therapeutic Inj  intramuscular or subcutaneous [96372] 2)  Vit B12 1000 mcg [J3420]   Medication Administration  Injection # 1:    Medication: Vit B12 1000 mcg    Diagnosis: BACK PAIN (ICD-724.5)    Route: IM    Site: R deltoid    Exp Date: 12/2011    Lot #: [id number]    Mfr: [employer]    Patient tolerated injection without complications    Given by: Angelina Ok RN (May 03, 2010 3:01 PM)  Orders Added: 1)  Admin of Therapeutic Inj  intramuscular or subcutaneous [96372] 2)  Vit B12 1000 mcg [J3420]

## 2010-07-03 NOTE — Progress Notes (Signed)
Summary: Injection  Phone Note Call from Patient   Summary of Call: Pt here says that she did not get notification of appointment change.  Given het Vit B 12 injection   Lot # [id number] expiration date 05/2009. IM left Deltoid.Angelina Ok RN  September 08, 2009 5:07 PM

## 2010-07-03 NOTE — Assessment & Plan Note (Signed)
Summary: B-12/CFB  Nurse Visit   Allergies: 1)  ! Sulfa 2)  ! Macrobid (Nitrofurantoin Monohyd Macro)  Medication Administration  Injection # 1:    Medication: Vit B12 1000 mcg    Diagnosis: B12 DEFICIENCY (ICD-266.2)    Route: IM    Site: L deltoid    Exp Date: 07/2011    Lot #: [id number]    Mfr: [employer]    Patient tolerated injection without complications    Given by: Chinita Pester RN (Oct 20, 2009 10:10 AM)  Orders Added: 1)  Admin of Therapeutic Inj  intramuscular or subcutaneous [96372] 2)  Vit B12 1000 mcg [J3420]   Medication Administration  Injection # 1:    Medication: Vit B12 1000 mcg    Diagnosis: B12 DEFICIENCY (ICD-266.2)    Route: IM    Site: L deltoid    Exp Date: 07/2011    Lot #: [id number]    Mfr: [employer]    Patient tolerated injection without complications    Given by: Chinita Pester RN (Oct 20, 2009 10:10 AM)  Orders Added: 1)  Admin of Therapeutic Inj  intramuscular or subcutaneous [96372] 2)  Vit B12 1000 mcg [J3420]

## 2010-07-03 NOTE — Assessment & Plan Note (Signed)
Summary: MONTHLY B12 INJ/CH  Nurse Visit   Allergies: 1)  ! Sulfa 2)  ! Macrobid (Nitrofurantoin Monohyd Macro)  Medication Administration  Injection # 1:    Medication: Vit B12 1000 mcg    Diagnosis: B12 DEFICIENCY (ICD-266.2)    Route: IM    Site: L deltoid    Exp Date: 10/2011    Lot #: [id number]    Mfr: [employer]    Patient tolerated injection without complications    Given by: Stanton Kidney Ditzler RN (March 07, 2010 2:44 PM)  Orders Added: 1)  Admin of Therapeutic Inj  intramuscular or subcutaneous [96372] 2)  Vit B12 1000 mcg [J3420]   Medication Administration  Injection # 1:    Medication: Vit B12 1000 mcg    Diagnosis: B12 DEFICIENCY (ICD-266.2)    Route: IM    Site: L deltoid    Exp Date: 10/2011    Lot #: [id number]    Mfr: [employer]    Patient tolerated injection without complications    Given by: Stanton Kidney Ditzler RN (March 07, 2010 2:44 PM)  Orders Added: 1)  Admin of Therapeutic Inj  intramuscular or subcutaneous [96372] 2)  Vit B12 1000 mcg [J3420]

## 2010-07-03 NOTE — Assessment & Plan Note (Signed)
Summary: INJECTION/DS  Nurse Visit   Allergies: 1)  ! Sulfa 2)  ! Macrobid (Nitrofurantoin Monohyd Macro)  Medication Administration  Injection # 1:    Medication: Vit B12 1000 mcg    Diagnosis: B12 DEFICIENCY (ICD-266.2)    Route: IM    Site: L deltoid    Exp Date: 09/2011    Lot #: [id number]    Mfr: [employer]    Patient tolerated injection without complications    Given by: Angelina Ok RN (December 22, 2009 10:21 AM)  Orders Added: 1)  Vit B12 1000 mcg [J3420] 2)  Admin of Therapeutic Inj  intramuscular or subcutaneous [96372]   Medication Administration  Injection # 1:    Medication: Vit B12 1000 mcg    Diagnosis: B12 DEFICIENCY (ICD-266.2)    Route: IM    Site: L deltoid    Exp Date: 09/2011    Lot #: [id number]    Mfr: [employer]    Patient tolerated injection without complications    Given by: Angelina Ok RN (December 22, 2009 10:21 AM)  Orders Added: 1)  Vit B12 1000 mcg [J3420] 2)  Admin of Therapeutic Inj  intramuscular or subcutaneous [[id number]]
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
## 2010-07-03 NOTE — Progress Notes (Signed)
Summary: tramadol refill//kg  Phone Note Outgoing Call   Refills Requested: Medication #1:  TRAMADOL HCL 50 MG TABS Take 1 tablet by mouth twice a day as needed for pain   Dosage confirmed as above?Dosage Confirmed   Brand Name Necessary? No   Supply Requested: 1 month   Last Refilled: 11/02/2009   Notes: pt does need now, but would like additional refill for next time Initial call taken by: Cynda Familia Duncan Dull),  November 03, 2009 10:55 AM Call placed by: Cynda Familia [facility]),  November 03, 2009 10:30 AM Call placed to: Patient Summary of Call: Call made to patient to inform her of lab results and rx for Vitamin D.  Rx to be called into [facility] on [address] in [place] [phone].  Pt just refilled her Tramadol yesterday and requesting addtional refills so she would not  have difficulty getting it filled next time. Will forward message to pcp. Initial call taken by: Cynda Familia Duncan Dull),  November 03, 2009 10:54 AM  Follow-up for Phone Call        she can call a few days ahead of time for refills.  thanks Follow-up by: Mariea Stable MD,  November 03, 2009 12:16 PM

## 2010-07-05 NOTE — Assessment & Plan Note (Signed)
Summary: ER/FU SB.   Vital Signs:  Patient profile:   65 year old female Height:      68.5 inches (173.99 cm) Weight:      158.04 pounds (71.84 kg) BMI:     23.77 Temp:     98.8 degrees F (37.11 degrees C) oral Pulse rate:   104 / minute BP sitting:   139 / 88  (right arm) Cuff size:   large  Vitals Entered By: Angelina Ok RN (June 11, 2010 1:15 PM) CC: Depression Is Patient Diabetic? No Pain Assessment Patient in pain? no      Nutritional Status BMI of 19 -24 = normal  Have you ever been in a relationship where you felt threatened, hurt or afraid?No   Does patient need assistance? Functional Status Self care Ambulation Normal Comments Stomach pains at least once a day.  Says that she will eat peanut butter and it will feel better.  Needs to see the Nutritionist.  Follow up on ER visit.  Vit B 12 visit.  Needs results of labs.   Primary Care Provider:  Mariea Stable MD  CC:  Depression.  History of Present Illness: 65 y/o w with h/o DJD back, depression, HLD, b12 def comes to the clinic for ED follow up  she was seen in the [place] ED 1 week ago for UTI where she was given antibiotic. she does not remember the name of it, She completed the course of antibiotics. she denies dysurea, burning but stil has some preassure like feeling in the back. no other complaints    Depression History:      The patient denies a depressed mood most of the day and a diminished interest in her usual daily activities.         Preventive Screening-Counseling & Management  Alcohol-Tobacco     Smoking Status: never     Passive Smoke Exposure: no  Current Medications (verified): 1)  Aspir-Low 81 Mg Tbec (Aspirin) .... Take 1 Tablet By Mouth Once A Day 2)  Cyanocobalamin 1000 Mcg/ml Soln (Cyanocobalamin) .... Im Monthly 3)  Flexeril 5 Mg  Tabs (Cyclobenzaprine Hcl) .... Take One Pill At Night Before Bedtime 4)  Albuterol 90 Mcg/act  Aers (Albuterol) .Marland Kitchen.. 1-2 Puffs Every 4  Hours As Needed For Shortness of Breath and Wheezing 5)  Claritin-D 24 Hour 10-240 Mg  Tb24 (Loratadine-Pseudoephedrine) .... Take One Tablet Daily 6)  Tramadol Hcl 50 Mg Tabs (Tramadol Hcl) .... Take 1 Tablet By Mouth Twice A Day As Needed For Pain 7)  Vitamin D 400 Unit Caps (Cholecalciferol) .... Take 2 Tablets By Mouth Daily  Allergies: 1)  ! Sulfa 2)  ! Macrobid (Nitrofurantoin Monohyd Macro) 3)  ! Pyridium  Review of Systems  The patient denies anorexia, fever, weight loss, weight gain, vision loss, decreased hearing, hoarseness, chest pain, syncope, dyspnea on exertion, peripheral edema, prolonged cough, headaches, hemoptysis, abdominal pain, melena, hematochezia, severe indigestion/heartburn, hematuria, incontinence, genital sores, muscle weakness, suspicious skin lesions, transient blindness, difficulty walking, depression, unusual weight change, abnormal bleeding, enlarged lymph nodes, angioedema, breast masses, and testicular masses.    Physical Exam  General:  Gen: VS reveiwed, Alert, well developed, nodistress ENT: mucous membranes pink & moist. No abnormal finds in ear and nose. CVC:S1 S2 , no murmurs, no abnormal heart sounds. Lungs: Clear to auscultation B/L. No wheezes, crackles or other abnormal sounds Abdomen: soft, non distended, no tender. Normal Bowel sounds EXT: no pitting edema, no engorged veins, Pulsations normal  Neuro:alert, oriented *3, cranial nerved 2-12 intact, strenght normal in all  extremities, senstations normal to light touch.      Impression & Recommendations:  Problem # 1:  UTI (ICD-599.0)  She had a UTI last week treated with antibiotic in the ED in [place]. She does not remember the name but says she completed the course. Her symptoms have resolved but she wants to get her urine checked anyway.   Will do UA today  Orders: T-Urinalysis Dipstick only (16109UE)  Complete Medication List: 1)  Aspir-low 81 Mg Tbec (Aspirin) .... Take 1  tablet by mouth once a day 2)  Cyanocobalamin 1000 Mcg/ml Soln (Cyanocobalamin) .... Im monthly 3)  Flexeril 5 Mg Tabs (Cyclobenzaprine hcl) .... Take one pill at night before bedtime 4)  Albuterol 90 Mcg/act Aers (Albuterol) .Marland Kitchen.. 1-2 puffs every 4 hours as needed for shortness of breath and wheezing 5)  Claritin-d 24 Hour 10-240 Mg Tb24 (Loratadine-pseudoephedrine) .... Take one tablet daily 6)  Tramadol Hcl 50 Mg Tabs (Tramadol hcl) .... Take 1 tablet by mouth twice a day as needed for pain 7)  Vitamin D 400 Unit Caps (Cholecalciferol) .... Take 2 tablets by mouth daily  Other Orders: Vit B12 1000 mcg (J3420) Admin of Therapeutic Inj  intramuscular or subcutaneous (45409)  Patient Instructions: 1)  Please schedule a follow-up appointment in 2-3  months with pcp.   Medication Administration  Injection # 1:    Medication: Vit B12 1000 mcg    Diagnosis: B12 DEFICIENCY (ICD-266.2)    Route: IM    Site: L deltoid    Exp Date: 12/2011    Lot #: [id number]    Mfr: [employer]    Patient tolerated injection without complications    Given by: Angelina Ok RN (June 11, 2010 1:40 PM)  Orders Added: 1)  Vit B12 1000 mcg [J3420] 2)  Admin of Therapeutic Inj  intramuscular or subcutaneous [96372] 3)  T-Urinalysis Dipstick only [81003QW] 4)  Est. Patient Level III [82956]    Prevention & Chronic Care Immunizations   Influenza vaccine: Not documented   Influenza vaccine deferral: Refused  (08/01/2009)    Tetanus booster: Not documented   Td booster deferral: Refused  (08/01/2009)    Pneumococcal vaccine: Not documented    H. zoster vaccine: Not documented   H. zoster vaccine deferral: Deferred  (08/01/2009)  Colorectal Screening   Hemoccult: Not documented   Hemoccult action/deferral: Ordered  (01/24/2010)    Colonoscopy: Not documented   Colonoscopy action/deferral: Refused  (01/18/2009)  Other Screening   Pap smear: Not documented   Pap smear  action/deferral: Not indicated S/P hysterectomy  (01/24/2010)    Mammogram: No specific mammographic evidence of malignancy.  Assessment: BIRADS 1. Location: [facility].     (01/13/2007)   Mammogram action/deferral: Ordered  (01/24/2010)   Mammogram due: 01/2008    DXA bone density scan: Not documented   Smoking status: never  (06/11/2010)  Lipids   Total Cholesterol: 177  (03/06/2009)   Lipid panel action/deferral: Lipid Panel ordered   LDL: 101  (03/06/2009)   LDL Direct: Not documented   HDL: 53  (03/06/2009)   Triglycerides: 116  (03/06/2009)    SGOT (AST): 14  (03/06/2009)   BMP action: Ordered   SGPT (ALT): 11  (03/06/2009)   Alkaline phosphatase: 102  (03/06/2009)   Total bilirubin: 0.5  (03/06/2009)  Self-Management Support :   Personal Goals (by the next clinic visit) :  Personal LDL goal: 130  (03/06/2009)    Patient will work on the following items until the next clinic visit to reach self-care goals:     Medications and monitoring: take my medicines every day, bring all of my medications to every visit  (06/11/2010)     Eating: drink diet soda or water instead of juice or soda, eat more vegetables, use fresh or frozen vegetables, eat foods that are low in salt, eat baked foods instead of fried foods, eat fruit for snacks and desserts, limit or avoid alcohol  (06/11/2010)     Activity: take a 30 minute walk every day  (06/11/2010)     Other: PT  (03/06/2009)    Lipid self-management support: Written self-care plan, Education handout, Pre-printed educational material, Resources for patients handout  (06/11/2010)   Lipid self-care plan printed.   Lipid education handout printed      Resource handout printed.    Medication Administration  Injection # 1:    Medication: Vit B12 1000 mcg    Diagnosis: B12 DEFICIENCY (ICD-266.2)    Route: IM    Site: L deltoid    Exp Date: 12/2011    Lot #: [id number]    Mfr: [employer]     Patient tolerated injection without complications    Given by: Angelina Ok RN (June 11, 2010 1:40 PM)  Orders Added: 1)  Vit B12 1000 mcg [J3420] 2)  Admin of Therapeutic Inj  intramuscular or subcutaneous [96372] 3)  T-Urinalysis Dipstick only [81003QW] 4)  Est. Patient Level III [45409]    Laboratory Results   Urine Tests  Date/Time Recieved: 06/11/2010 2:10 PM GH Date/Time Reported: 06/11/2010 2:13 PM GH  Routine Urinalysis   Color: lt. yellow Appearance: Hazy Glucose: negative   (Normal Range: Negative) Bilirubin: negative   (Normal Range: Negative) Ketone: negative   (Normal Range: Negative) Spec. Gravity: 1.010   (Normal Range: 1.003-1.035) Blood: trace-intact   (Normal Range: Negative) pH: 5.5   (Normal Range: 5.0-8.0) Protein: negative   (Normal Range: Negative) Urobilinogen: 0.2   (Normal Range: 0-1) Nitrite: negative   (Normal Range: Negative) Leukocyte Esterace: negative   (Normal Range: Negative)

## 2010-07-31 ENCOUNTER — Ambulatory Visit (INDEPENDENT_AMBULATORY_CARE_PROVIDER_SITE_OTHER): Payer: Medicaid Other | Admitting: *Deleted

## 2010-07-31 DIAGNOSIS — E538 Deficiency of other specified B group vitamins: Secondary | ICD-10-CM

## 2010-07-31 MED ORDER — CYANOCOBALAMIN 1000 MCG/ML IJ SOLN
1000.0000 ug | Freq: Once | INTRAMUSCULAR | Status: AC
  Administered 2010-07-31: 1000 ug via INTRAMUSCULAR

## 2010-08-02 ENCOUNTER — Other Ambulatory Visit: Payer: Self-pay | Admitting: *Deleted

## 2010-08-03 MED ORDER — ALBUTEROL 90 MCG/ACT IN AERS: 2.0000 | INHALATION_SPRAY | RESPIRATORY_TRACT | Status: DC | PRN

## 2010-08-03 MED ORDER — VITAMIN D 400 UNITS PO CAPS: ORAL_CAPSULE | ORAL | Status: DC

## 2010-08-27 LAB — URINALYSIS, ROUTINE W REFLEX MICROSCOPIC
Bilirubin Urine: NEGATIVE
Glucose, UA: NEGATIVE mg/dL
Ketones, ur: 15 mg/dL — AB
Protein, ur: NEGATIVE mg/dL

## 2010-08-27 LAB — URINE CULTURE: Colony Count: >=100000

## 2010-09-03 ENCOUNTER — Ambulatory Visit (INDEPENDENT_AMBULATORY_CARE_PROVIDER_SITE_OTHER): Payer: Medicaid Other | Admitting: *Deleted

## 2010-09-03 DIAGNOSIS — E538 Deficiency of other specified B group vitamins: Secondary | ICD-10-CM

## 2010-09-03 MED ORDER — CYANOCOBALAMIN 1000 MCG/ML IJ SOLN
1000.0000 ug | Freq: Once | INTRAMUSCULAR | Status: AC
  Administered 2010-09-03: 1000 ug via INTRAMUSCULAR

## 2010-09-05 LAB — URINE MICROSCOPIC-ADD ON

## 2010-09-05 LAB — URINALYSIS, ROUTINE W REFLEX MICROSCOPIC
Bilirubin Urine: NEGATIVE
Glucose, UA: NEGATIVE mg/dL
Hgb urine dipstick: NEGATIVE
Ketones, ur: NEGATIVE mg/dL
Nitrite: NEGATIVE
Protein, ur: NEGATIVE mg/dL
Specific Gravity, Urine: 1.013 (ref 1.005–1.030)
Urobilinogen, UA: 0.2 mg/dL (ref 0.0–1.0)
pH: 5.5 (ref 5.0–8.0)

## 2010-09-05 LAB — URINE CULTURE

## 2010-09-17 LAB — CREATININE, SERUM: GFR calc Af Amer: >60 mL/min (ref >60)

## 2010-10-04 ENCOUNTER — Ambulatory Visit (INDEPENDENT_AMBULATORY_CARE_PROVIDER_SITE_OTHER): Payer: Medicaid Other | Admitting: *Deleted

## 2010-10-04 ENCOUNTER — Other Ambulatory Visit: Payer: Self-pay | Admitting: *Deleted

## 2010-10-04 DIAGNOSIS — E538 Deficiency of other specified B group vitamins: Secondary | ICD-10-CM

## 2010-10-04 MED ORDER — CYANOCOBALAMIN 1000 MCG/ML IJ SOLN
1000.0000 ug | Freq: Once | INTRAMUSCULAR | Status: AC
  Administered 2010-10-04: 1000 ug via INTRAMUSCULAR

## 2010-10-04 MED ORDER — TRAMADOL HCL 50 MG PO TABS: 50.0000 mg | ORAL_TABLET | Freq: Two times a day (BID) | ORAL | Status: DC | PRN

## 2010-10-04 NOTE — Telephone Encounter (Signed)
Pt states she is completely out ?

## 2010-10-24 ENCOUNTER — Encounter: Payer: Self-pay | Admitting: Internal Medicine

## 2010-10-24 ENCOUNTER — Ambulatory Visit (INDEPENDENT_AMBULATORY_CARE_PROVIDER_SITE_OTHER): Payer: Medicaid Other | Admitting: Internal Medicine

## 2010-10-24 DIAGNOSIS — M791 Myalgia, unspecified site: Secondary | ICD-10-CM

## 2010-10-24 DIAGNOSIS — Z1239 Encounter for other screening for malignant neoplasm of breast: Secondary | ICD-10-CM

## 2010-10-24 DIAGNOSIS — Z1211 Encounter for screening for malignant neoplasm of colon: Secondary | ICD-10-CM

## 2010-10-24 DIAGNOSIS — IMO0001 Reserved for inherently not codable concepts without codable children: Secondary | ICD-10-CM

## 2010-10-24 DIAGNOSIS — D649 Anemia, unspecified: Secondary | ICD-10-CM

## 2010-10-24 DIAGNOSIS — Z1231 Encounter for screening mammogram for malignant neoplasm of breast: Secondary | ICD-10-CM

## 2010-10-24 DIAGNOSIS — E559 Vitamin D deficiency, unspecified: Secondary | ICD-10-CM

## 2010-10-24 DIAGNOSIS — E538 Deficiency of other specified B group vitamins: Secondary | ICD-10-CM

## 2010-10-24 MED ORDER — CYCLOBENZAPRINE HCL 5 MG PO TABS: 5.0000 mg | ORAL_TABLET | Freq: Every evening | ORAL | Status: DC | PRN

## 2010-10-24 MED ORDER — CYANOCOBALAMIN 1000 MCG/ML IJ SOLN
1000.0000 ug | INTRAMUSCULAR | Status: DC
  Administered 2010-10-24 – 2010-12-31 (×3): 1000 ug via INTRAMUSCULAR

## 2010-10-24 MED ORDER — TRAMADOL HCL 50 MG PO TABS: 50.0000 mg | ORAL_TABLET | Freq: Two times a day (BID) | ORAL | Status: DC | PRN

## 2010-10-24 MED ORDER — VITAMIN D 400 UNITS PO CAPS: ORAL_CAPSULE | ORAL | Status: DC

## 2010-10-24 NOTE — Patient Instructions (Signed)
Please schedule follow up appointment in 5-7 months.  Will put in another referral for rheumatologist. Will give B12 injection today Will put in referral for GI for screening colonoscopy Will put in for mammogram. Otherwise, continue with your medications and call clinic if you have any problems before your next visit.

## 2010-10-24 NOTE — Progress Notes (Signed)
  Subjective:    Patient ID: Rachel Murray, female    DOB: 1945-09-07, 65 y.o.   MRN: [id number]  HPI Here for routine follow up.  No new complaints.  Reports ongoing muscle aches, continues to use tramadol and flexeril prn.  Reports that she was not seen at [facility] rheumatology because of issue with different last name on record.  She asks if B12 can be given today as she will not be able to come in for her scheduled injection.   Review of Systems    As per HPI Objective:   Physical Exam  Constitutional: She is oriented to person, place, and time. She appears well-developed and well-nourished. No distress.  HENT:  Head: Normocephalic and atraumatic.  Eyes: Conjunctivae and EOM are normal. Pupils are equal, round, and reactive to light.  Neck: No JVD present.  Cardiovascular: Normal rate and regular rhythm.  Exam reveals friction rub. Exam reveals no gallop.   No murmur heard. Pulmonary/Chest: No respiratory distress. She has no wheezes. She has no rales.  Abdominal: Soft. Bowel sounds are normal. There is no tenderness.  Musculoskeletal: Normal range of motion. She exhibits no edema.  Neurological: She is alert and oriented to person, place, and time.  Psychiatric: She has a normal mood and affect. Her behavior is normal.          Assessment & Plan:

## 2010-10-24 NOTE — Assessment & Plan Note (Signed)
Had [facility] rheumatology appointment 03/07/10 but problem with last name.  Was not seen, appointment not rescheduled. Will refer back. Please see PMH for details.

## 2010-10-24 NOTE — Assessment & Plan Note (Addendum)
Had [facility] rheumatology appointment 03/07/10 but problem with last name.  Was not seen, appointment not rescheduled. Will refer back. Please see PMH for details.  Given prior work up and other symptoms, likely fibromyalgia.

## 2010-10-24 NOTE — Assessment & Plan Note (Signed)
C/w vit D supplementation.

## 2010-10-24 NOTE — Assessment & Plan Note (Signed)
Will continue with repletion.

## 2010-11-13 ENCOUNTER — Emergency Department (HOSPITAL_BASED_OUTPATIENT_CLINIC_OR_DEPARTMENT_OTHER)
Admission: EM | Admit: 2010-11-13 | Discharge: 2010-11-13 | Disposition: A | Discharge status: Home / Self Care | Payer: Medicaid Other | Attending: Emergency Medicine | Admitting: Emergency Medicine

## 2010-11-13 DIAGNOSIS — J45909 Unspecified asthma, uncomplicated: Secondary | ICD-10-CM | POA: Insufficient documentation

## 2010-11-13 DIAGNOSIS — Z8739 Personal history of other diseases of the musculoskeletal system and connective tissue: Secondary | ICD-10-CM | POA: Insufficient documentation

## 2010-11-13 DIAGNOSIS — N390 Urinary tract infection, site not specified: Secondary | ICD-10-CM | POA: Insufficient documentation

## 2010-11-13 LAB — URINE MICROSCOPIC-ADD ON

## 2010-11-13 LAB — URINALYSIS, ROUTINE W REFLEX MICROSCOPIC
Bilirubin Urine: NEGATIVE
Glucose, UA: NEGATIVE mg/dL
Ketones, ur: NEGATIVE mg/dL
Specific Gravity, Urine: 1.011 (ref 1.005–1.030)
pH: 6 (ref 5.0–8.0)

## 2010-11-15 LAB — URINE CULTURE: Culture  Setup Time: 201206130141

## 2010-11-22 ENCOUNTER — Emergency Department (HOSPITAL_BASED_OUTPATIENT_CLINIC_OR_DEPARTMENT_OTHER)
Admission: EM | Admit: 2010-11-22 | Discharge: 2010-11-22 | Disposition: A | Discharge status: Home / Self Care | Payer: Medicaid Other | Attending: Emergency Medicine | Admitting: Emergency Medicine

## 2010-11-22 DIAGNOSIS — J45909 Unspecified asthma, uncomplicated: Secondary | ICD-10-CM | POA: Insufficient documentation

## 2010-11-22 DIAGNOSIS — N390 Urinary tract infection, site not specified: Secondary | ICD-10-CM | POA: Insufficient documentation

## 2010-11-22 LAB — URINALYSIS, ROUTINE W REFLEX MICROSCOPIC
Bilirubin Urine: NEGATIVE
Nitrite: POSITIVE — AB
Specific Gravity, Urine: 1.01 (ref 1.005–1.030)
Urobilinogen, UA: 0.2 mg/dL (ref 0.0–1.0)
pH: 5.5 (ref 5.0–8.0)

## 2010-11-22 LAB — URINE MICROSCOPIC-ADD ON

## 2010-11-24 LAB — URINE CULTURE

## 2010-11-27 ENCOUNTER — Ambulatory Visit (INDEPENDENT_AMBULATORY_CARE_PROVIDER_SITE_OTHER): Payer: Medicaid Other | Admitting: *Deleted

## 2010-11-27 ENCOUNTER — Other Ambulatory Visit: Payer: Self-pay | Admitting: *Deleted

## 2010-11-27 DIAGNOSIS — E538 Deficiency of other specified B group vitamins: Secondary | ICD-10-CM

## 2010-11-27 MED ORDER — CYANOCOBALAMIN 1000 MCG/ML IJ SOLN
1000.0000 ug | Freq: Once | INTRAMUSCULAR | Status: AC
  Administered 2011-02-06: 1000 ug via INTRAMUSCULAR

## 2010-11-27 MED ORDER — TRAMADOL HCL 50 MG PO TABS: 50.0000 mg | ORAL_TABLET | Freq: Two times a day (BID) | ORAL | Status: DC | PRN

## 2010-11-27 NOTE — Telephone Encounter (Signed)
Pt  Is here for B12 injection; states yesterday ,6/25, she received only 40 tablets of Tramadol but needs 60 tablets since she takes it twice a day. Also states she will need 20 more tablets to last until her next refill 7/25. Thanks

## 2010-12-15 ENCOUNTER — Encounter: Payer: Self-pay | Admitting: Internal Medicine

## 2010-12-18 ENCOUNTER — Ambulatory Visit: Payer: Medicaid Other

## 2010-12-27 ENCOUNTER — Ambulatory Visit: Payer: Medicaid Other

## 2010-12-31 ENCOUNTER — Ambulatory Visit (INDEPENDENT_AMBULATORY_CARE_PROVIDER_SITE_OTHER): Payer: Medicaid Other | Admitting: *Deleted

## 2010-12-31 DIAGNOSIS — E538 Deficiency of other specified B group vitamins: Secondary | ICD-10-CM

## 2011-01-01 ENCOUNTER — Ambulatory Visit: Payer: Medicaid Other

## 2011-02-01 ENCOUNTER — Ambulatory Visit: Payer: Medicaid Other

## 2011-02-06 ENCOUNTER — Ambulatory Visit: Payer: Medicaid Other

## 2011-02-06 ENCOUNTER — Ambulatory Visit (INDEPENDENT_AMBULATORY_CARE_PROVIDER_SITE_OTHER): Payer: Medicaid Other | Admitting: *Deleted

## 2011-02-06 DIAGNOSIS — E538 Deficiency of other specified B group vitamins: Secondary | ICD-10-CM

## 2011-02-12 ENCOUNTER — Inpatient Hospital Stay: Admission: RE | Admit: 2011-02-12 | Payer: Medicaid Other | Source: Ambulatory Visit

## 2011-03-05 ENCOUNTER — Ambulatory Visit (INDEPENDENT_AMBULATORY_CARE_PROVIDER_SITE_OTHER): Payer: Medicaid Other | Admitting: *Deleted

## 2011-03-05 DIAGNOSIS — E538 Deficiency of other specified B group vitamins: Secondary | ICD-10-CM

## 2011-03-05 MED ORDER — CYANOCOBALAMIN 1000 MCG/ML IJ SOLN
1000.0000 ug | Freq: Once | INTRAMUSCULAR | Status: AC
  Administered 2011-03-05: 1000 ug via INTRAMUSCULAR

## 2011-04-08 ENCOUNTER — Encounter: Payer: Medicaid Other | Admitting: Internal Medicine

## 2011-05-17 ENCOUNTER — Encounter: Payer: Medicaid Other | Admitting: Internal Medicine

## 2011-05-21 ENCOUNTER — Encounter: Payer: Medicaid Other | Admitting: Internal Medicine

## 2011-06-10 ENCOUNTER — Encounter: Payer: Self-pay | Admitting: Internal Medicine

## 2011-06-10 ENCOUNTER — Ambulatory Visit (INDEPENDENT_AMBULATORY_CARE_PROVIDER_SITE_OTHER): Payer: Medicaid Other | Admitting: Internal Medicine

## 2011-06-10 DIAGNOSIS — Z23 Encounter for immunization: Secondary | ICD-10-CM

## 2011-06-10 DIAGNOSIS — M791 Myalgia, unspecified site: Secondary | ICD-10-CM

## 2011-06-10 DIAGNOSIS — E538 Deficiency of other specified B group vitamins: Secondary | ICD-10-CM

## 2011-06-10 DIAGNOSIS — D649 Anemia, unspecified: Secondary | ICD-10-CM

## 2011-06-10 DIAGNOSIS — Z299 Encounter for prophylactic measures, unspecified: Secondary | ICD-10-CM

## 2011-06-10 DIAGNOSIS — E559 Vitamin D deficiency, unspecified: Secondary | ICD-10-CM

## 2011-06-10 DIAGNOSIS — IMO0001 Reserved for inherently not codable concepts without codable children: Secondary | ICD-10-CM

## 2011-06-10 DIAGNOSIS — J45991 Cough variant asthma: Secondary | ICD-10-CM

## 2011-06-10 DIAGNOSIS — Z8619 Personal history of other infectious and parasitic diseases: Secondary | ICD-10-CM

## 2011-06-10 LAB — CBC
MCH: 32.6 pg (ref 26.0–34.0)
MCHC: 33.2 g/dL (ref 30.0–36.0)
MCV: 98 fL (ref 78.0–100.0)
Platelets: 242 10*3/uL (ref 150–400)
RDW: 12.9 % (ref 11.5–15.5)

## 2011-06-10 LAB — COMPREHENSIVE METABOLIC PANEL
ALT: 10 U/L (ref 0–35)
AST: 15 U/L (ref 0–37)
Albumin: 4.2 g/dL (ref 3.5–5.2)
Alkaline Phosphatase: 81 U/L (ref 39–117)
Potassium: 4.3 mEq/L (ref 3.5–5.3)
Sodium: 139 mEq/L (ref 135–145)
Total Bilirubin: 0.4 mg/dL (ref 0.3–1.2)
Total Protein: 6.6 g/dL (ref 6.0–8.3)

## 2011-06-10 LAB — LIPID PANEL
LDL Cholesterol: 83 mg/dL (ref 0–99)
Triglycerides: 71 mg/dL (ref <150)
VLDL: 14 mg/dL (ref 0–40)

## 2011-06-10 LAB — VITAMIN B12: Vitamin B-12: >2000 pg/mL — ABNORMAL HIGH (ref 211–911)

## 2011-06-10 LAB — HEPATITIS C ANTIBODY: HCV Ab: NEGATIVE

## 2011-06-10 MED ORDER — GABAPENTIN 300 MG PO CAPS: 300.0000 mg | ORAL_CAPSULE | Freq: Three times a day (TID) | ORAL | Status: DC

## 2011-06-10 MED ORDER — VITAMIN D 400 UNITS PO CAPS: ORAL_CAPSULE | ORAL | Status: DC

## 2011-06-10 MED ORDER — ASPIRIN 81 MG PO TABS: 81.0000 mg | ORAL_TABLET | Freq: Every day | ORAL | Status: DC

## 2011-06-10 MED ORDER — CYANOCOBALAMIN 1000 MCG/ML IJ SOLN
1000.0000 ug | Freq: Once | INTRAMUSCULAR | Status: AC
  Administered 2011-06-10: 1000 ug via INTRAMUSCULAR

## 2011-06-10 MED ORDER — CYCLOBENZAPRINE HCL 5 MG PO TABS: 5.0000 mg | ORAL_TABLET | Freq: Every evening | ORAL | Status: DC | PRN

## 2011-06-10 MED ORDER — ALBUTEROL SULFATE HFA 108 (90 BASE) MCG/ACT IN AERS: 2.0000 | INHALATION_SPRAY | Freq: Four times a day (QID) | RESPIRATORY_TRACT | Status: DC | PRN

## 2011-06-10 MED ORDER — TRAMADOL HCL 50 MG PO TABS: 50.0000 mg | ORAL_TABLET | Freq: Two times a day (BID) | ORAL | Status: DC | PRN

## 2011-06-11 ENCOUNTER — Encounter: Payer: Self-pay | Admitting: Internal Medicine

## 2011-06-11 DIAGNOSIS — Z0000 Encounter for general adult medical examination without abnormal findings: Secondary | ICD-10-CM | POA: Insufficient documentation

## 2011-06-11 MED ORDER — ERGOCALCIFEROL 1.25 MG (50000 UT) PO CAPS: 50000.0000 [IU] | ORAL_CAPSULE | ORAL | Status: DC

## 2011-06-11 NOTE — Assessment & Plan Note (Signed)
Patient has history of low vitamin D and and is currently only taking vitamin D 800 units. I believe this is not enough considering vitamin D. level of 19 in the past. I will obtain level today and change manage accordingly.  Update 06/10/10: Vitamin D level is 12. Therefore patient needs 50,000 units once a week for 6-8 weeks and after that 800 units this practice has not been very well established in the literature for up to date.
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
## 2011-06-11 NOTE — Assessment & Plan Note (Signed)
Received tetanus vaccination Refused influenza vaccination Refer for mammogram screening Refer for colonoscopy.

## 2011-06-11 NOTE — Assessment & Plan Note (Signed)
Patient has no pulmonary function test. Will discuss about it during the next office visit.

## 2011-06-11 NOTE — Assessment & Plan Note (Signed)
Unclear to me the cost off the B12 deficiency at this point. May need to get more records. At this point I'll continue to replete vitamin B12. Patient presently states asked that vitamin B12 is hoping her with her myalgia.

## 2011-06-11 NOTE — Assessment & Plan Note (Signed)
Patient is currently taking aspirin, Flexeril and tramadol which is controlling her myalgia. Considering her neuropathic pain I will start patient on gabapentin . He was supposed to be referred to [facility] for further evaluation do to some misunderstanding was not able to get an appointment. I will we get the next office visit for referral. I would like to review more records prior to that.

## 2011-06-12 ENCOUNTER — Encounter: Payer: Self-pay | Admitting: Internal Medicine

## 2011-07-11 ENCOUNTER — Ambulatory Visit (HOSPITAL_COMMUNITY): Payer: Medicaid Other

## 2011-07-31 ENCOUNTER — Ambulatory Visit (INDEPENDENT_AMBULATORY_CARE_PROVIDER_SITE_OTHER): Payer: Medicaid Other | Admitting: *Deleted

## 2011-07-31 DIAGNOSIS — E538 Deficiency of other specified B group vitamins: Secondary | ICD-10-CM

## 2011-07-31 MED ORDER — CYANOCOBALAMIN 1000 MCG/ML IJ SOLN: 1000.0000 ug | Freq: Once | INTRAMUSCULAR | Status: DC

## 2011-07-31 MED ORDER — CYANOCOBALAMIN 1000 MCG/ML IJ SOLN
1000.0000 ug | Freq: Once | INTRAMUSCULAR | Status: AC
  Administered 2011-07-31: 1000 ug via INTRAMUSCULAR

## 2011-08-01 ENCOUNTER — Other Ambulatory Visit: Payer: Medicaid Other | Admitting: Internal Medicine

## 2011-08-07 ENCOUNTER — Ambulatory Visit (HOSPITAL_COMMUNITY): Payer: Medicaid Other

## 2011-08-19 ENCOUNTER — Encounter: Payer: Medicaid Other | Admitting: Internal Medicine

## 2011-08-20 ENCOUNTER — Other Ambulatory Visit: Payer: Medicaid Other | Admitting: Internal Medicine

## 2011-09-04 ENCOUNTER — Ambulatory Visit (HOSPITAL_COMMUNITY)
Admission: RE | Admit: 2011-09-04 | Discharge: 2011-09-04 | Disposition: A | Discharge status: Home / Self Care | Payer: Medicaid Other | Source: Ambulatory Visit | Attending: Internal Medicine | Admitting: Internal Medicine

## 2011-09-04 ENCOUNTER — Other Ambulatory Visit: Payer: Self-pay | Admitting: Internal Medicine

## 2011-09-04 ENCOUNTER — Encounter: Payer: Self-pay | Admitting: *Deleted

## 2011-09-04 ENCOUNTER — Ambulatory Visit (INDEPENDENT_AMBULATORY_CARE_PROVIDER_SITE_OTHER): Payer: Medicaid Other | Admitting: Internal Medicine

## 2011-09-04 VITALS — BP 131/73 | HR 84 | Temp 97.0°F | Ht 68.0 in | Wt 146.9 lb

## 2011-09-04 DIAGNOSIS — Z299 Encounter for prophylactic measures, unspecified: Secondary | ICD-10-CM

## 2011-09-04 DIAGNOSIS — Z1231 Encounter for screening mammogram for malignant neoplasm of breast: Secondary | ICD-10-CM | POA: Insufficient documentation

## 2011-09-04 DIAGNOSIS — E538 Deficiency of other specified B group vitamins: Secondary | ICD-10-CM

## 2011-09-04 DIAGNOSIS — H538 Other visual disturbances: Secondary | ICD-10-CM

## 2011-09-04 DIAGNOSIS — R109 Unspecified abdominal pain: Secondary | ICD-10-CM

## 2011-09-04 LAB — URINALYSIS, ROUTINE W REFLEX MICROSCOPIC
Ketones, ur: NEGATIVE mg/dL
Nitrite: NEGATIVE
Specific Gravity, Urine: 1.019 (ref 1.005–1.030)
pH: 6 (ref 5.0–8.0)

## 2011-09-04 LAB — URINALYSIS, MICROSCOPIC ONLY
Casts: NONE SEEN
Crystals: NONE SEEN
RBC / HPF: NONE SEEN RBC/hpf (ref <3)

## 2011-09-04 MED ORDER — TAMSULOSIN HCL 0.4 MG PO CAPS: 0.4000 mg | ORAL_CAPSULE | Freq: Every day | ORAL | Status: DC

## 2011-09-04 MED ORDER — CYANOCOBALAMIN 1000 MCG/ML IJ SOLN
1000.0000 ug | Freq: Once | INTRAMUSCULAR | Status: AC
  Administered 2011-09-04: 1000 ug via INTRAMUSCULAR

## 2011-09-04 NOTE — Patient Instructions (Addendum)
You most likely have a kidney stone.  We are checking your urine, and will let you know if we need to do an Ultrasound to find out for sure. -we are writing you a prescription for a strainer to put in the toilet.  If you recover a stone, bring it to our lab for evaluation. -we are also prescribing a medication called Tamsulosin.  Take 1 tablet daily to help pass the stone more quickly.  Please return in 6 months.  If your pain does not go away, or if your labs are concerning, we will ask you to return sooner.  Kidney Stones Kidney stones (ureteral lithiasis) are deposits that form inside your kidneys. The intense pain is caused by the stone moving through the urinary tract. When the stone moves, the ureter goes into spasm around the stone. The stone is usually passed in the urine.  CAUSES   A disorder that makes certain neck glands produce too much parathyroid hormone (primary hyperparathyroidism).   A buildup of uric acid crystals.   Narrowing (stricture) of the ureter.   A kidney obstruction present at birth (congenital obstruction).   Previous surgery on the kidney or ureters.   Numerous kidney infections.  SYMPTOMS   Feeling sick to your stomach (nauseous).   Throwing up (vomiting).   Blood in the urine (hematuria).   Pain that usually spreads (radiates) to the groin.   Frequency or urgency of urination.  DIAGNOSIS   Taking a history and physical exam.   Blood or urine tests.   Computerized X-ray scan (CT scan).   Occasionally, an examination of the inside of the urinary bladder (cystoscopy) is performed.  TREATMENT   Observation.   Increasing your fluid intake.   Surgery may be needed if you have severe pain or persistent obstruction.  The size, location, and chemical composition are all important variables that will determine the proper choice of action for you. Talk to your caregiver to better understand your situation so that you will minimize the risk of  injury to yourself and your kidney.  HOME CARE INSTRUCTIONS   Drink enough water and fluids to keep your urine clear or pale yellow.   Strain all urine through the provided strainer. Keep all particulate matter and stones for your caregiver to see. The stone causing the pain may be as small as a grain of salt. It is very important to use the strainer each and every time you pass your urine. The collection of your stone will allow your caregiver to analyze it and verify that a stone has actually passed.   Only take over-the-counter or prescription medicines for pain, discomfort, or fever as directed by your caregiver.   Make a follow-up appointment with your caregiver as directed.   Get follow-up X-rays if required. The absence of pain does not always mean that the stone has passed. It may have only stopped moving. If the urine remains completely obstructed, it can cause loss of kidney function or even complete destruction of the kidney. It is your responsibility to make sure X-rays and follow-ups are completed. Ultrasounds of the kidney can show blockages and the status of the kidney. Ultrasounds are not associated with any radiation and can be performed easily in a matter of minutes.  SEEK IMMEDIATE MEDICAL CARE IF:   Pain cannot be controlled with the prescribed medicine.   You have a fever.   The severity or intensity of pain increases over 18 hours and is not relieved by  pain medicine.   You develop a new onset of abdominal pain.   You feel faint or pass out.  MAKE SURE YOU:   Understand these instructions.   Will watch your condition.   Will get help right away if you are not doing well or get worse.  Document Released: 05/20/2005 Document Revised: 05/09/2011 Document Reviewed: 09/15/2009 [facility] Patient Information 2012 [place], [place].

## 2011-09-04 NOTE — Progress Notes (Signed)
Agree. Thanks

## 2011-09-04 NOTE — Progress Notes (Unsigned)
Pt presents c/o "kidney stone" pain R side x 2 weeks progressively getting worse. Denies N&V, fevers. Took tramadol at appr 1050 and states pain is still 7/10. She is given appt today at 1315 dr brown per chilonb.

## 2011-09-05 ENCOUNTER — Encounter: Payer: Self-pay | Admitting: Internal Medicine

## 2011-09-05 DIAGNOSIS — M5430 Sciatica, unspecified side: Secondary | ICD-10-CM

## 2011-09-05 HISTORY — DX: Sciatica, unspecified side: M54.30

## 2011-09-05 LAB — BASIC METABOLIC PANEL
CO2: 28 mEq/L (ref 19–32)
Chloride: 106 mEq/L (ref 96–112)
Potassium: 4 mEq/L (ref 3.5–5.3)
Sodium: 140 mEq/L (ref 135–145)

## 2011-09-05 LAB — CBC WITH DIFFERENTIAL/PLATELET
Basophils Absolute: 0 10*3/uL (ref 0.0–0.1)
HCT: 32.9 % — ABNORMAL LOW (ref 36.0–46.0)
Lymphocytes Relative: 31 % (ref 12–46)
Neutro Abs: 3.5 10*3/uL (ref 1.7–7.7)
Neutrophils Relative %: 58 % (ref 43–77)
Platelets: 225 10*3/uL (ref 150–400)
RDW: 13.4 % (ref 11.5–15.5)
WBC: 6.1 10*3/uL (ref 4.0–10.5)

## 2011-09-05 NOTE — Assessment & Plan Note (Signed)
The patient notes a 2-week history of R flank pain, radiating to the groin, with a history of nephrolithiasis.  Symptoms most likely represent nephrolithiasis.  May also represent UTI.  Unlikely msk, as lumbar spine and back exam unremarkable. -UA, urine culture -BMET, CBC -if labs suggest ureteral obstruction, may need to send for renal US  Addendum 09/05/11 - labs unremarkable, UA probably contaminated.  No obvious source of pain, though I still suspect nephrolithiasis despite absence of blood on UA.  Will send for [place] bilateral kidneys, and await urine culture results

## 2011-09-05 NOTE — Progress Notes (Signed)
HPI The patient is a 66 yo woman, history of frequent UTI's and prior nephrolithiasis, presenting for an acute visit for right flank pain.  The patient notes a 2-week history of right flank pain, radiating to her groin.  She notes no dysuria, hematuria, frequency, or urgency of urination, and no fevers.  No abd pain, nausea, vomiting, diarrhea, or constipation.  She states that she has had several kidney stones in the past, the most recent one of which was 1 year ago.  She has been taking tramadol for the pain, which has not relieved her symptoms.  The patient is s/p hysterectomy, and has no history of ovarian cysts.  ROS: General: no fevers, chills, changes in weight, changes in appetite Skin: no rash HEENT: no blurry vision, hearing changes, sore throat Pulm: no dyspnea, coughing, wheezing CV: no chest pain, palpitations, shortness of breath Abd: no abdominal pain, nausea/vomiting, diarrhea/constipation GU: see HPI Ext: no arthralgias, myalgias Neuro: no weakness, numbness, or tingling  Filed Vitals:   09/04/11 1329  BP: 131/73  Pulse: 84  Temp: 97 F (36.1 C)    PEX General: alert, cooperative, and in no apparent distress HEENT: pupils equal round and reactive to light, vision grossly intact, oropharynx clear and non-erythematous  Neck: supple, no lymphadenopathy Lungs: clear to ascultation bilaterally, normal work of respiration, no wheezes, rales, ronchi Heart: regular rate and rhythm, no murmurs, gallops, or rubs Abdomen: soft, minimally tender to deep suprapubic palpation, non-distended, +bs   Back: mildly tender to deep right flank palpation, no impressive CVA tenderness, no tenderness to lumbar spine or surrounding muscle groups, no pain with right straight leg raise Extremities: no cyanosis, clubbing, or edema Neurologic: alert & oriented X3, cranial nerves II-XII intact, strength grossly intact, sensation intact to light touch  Assessment/Plan

## 2011-09-06 ENCOUNTER — Other Ambulatory Visit (INDEPENDENT_AMBULATORY_CARE_PROVIDER_SITE_OTHER): Payer: Medicaid Other

## 2011-09-06 ENCOUNTER — Other Ambulatory Visit: Payer: Self-pay | Admitting: Internal Medicine

## 2011-09-06 ENCOUNTER — Telehealth: Payer: Self-pay | Admitting: Internal Medicine

## 2011-09-06 ENCOUNTER — Ambulatory Visit (HOSPITAL_COMMUNITY)
Admission: RE | Admit: 2011-09-06 | Discharge: 2011-09-06 | Disposition: A | Discharge status: Home / Self Care | Payer: Medicaid Other | Source: Ambulatory Visit | Attending: Internal Medicine | Admitting: Internal Medicine

## 2011-09-06 DIAGNOSIS — R109 Unspecified abdominal pain: Secondary | ICD-10-CM

## 2011-09-06 DIAGNOSIS — N200 Calculus of kidney: Secondary | ICD-10-CM

## 2011-09-06 DIAGNOSIS — Z1231 Encounter for screening mammogram for malignant neoplasm of breast: Secondary | ICD-10-CM

## 2011-09-06 LAB — URINE CULTURE
Colony Count: NO GROWTH
Organism ID, Bacteria: NO GROWTH

## 2011-09-06 LAB — POCT URINALYSIS DIPSTICK
Bilirubin, UA: NEGATIVE
Glucose, UA: NEGATIVE
Ketones, UA: NEGATIVE
Nitrite, UA: NEGATIVE
Protein, UA: NEGATIVE
Spec Grav, UA: 1.01
Urobilinogen, UA: 0.2
pH, UA: 5

## 2011-09-06 MED ORDER — CIPROFLOXACIN HCL 250 MG PO TABS: 250.0000 mg | ORAL_TABLET | Freq: Two times a day (BID) | ORAL | Status: AC

## 2011-09-06 MED ORDER — HYDROCODONE-ACETAMINOPHEN 5-500 MG PO TABS: 1.0000 | ORAL_TABLET | Freq: Four times a day (QID) | ORAL | Status: DC | PRN

## 2011-09-06 MED ORDER — HYDROCODONE-ACETAMINOPHEN 5-500 MG PO TABS: 2.0000 | ORAL_TABLET | Freq: Four times a day (QID) | ORAL | Status: DC | PRN

## 2011-09-06 NOTE — Progress Notes (Signed)
Rx for Vicodin called in. Correction made for 1 tablet every 6 hours, called to [facility].

## 2011-09-06 NOTE — Telephone Encounter (Signed)
U/S normal. Pt to check temp over weekend. To call us Monday if no better. May need CT if pain persists.

## 2011-09-06 NOTE — Progress Notes (Signed)
Pt with 2 weeks increasing R flank pain that is now into R groin. Increased sig and tylenol, IBU, and tramadol is not helping the pain. Saw Dr Manson Passey UA not clean catch and +LE but no blood. H/O kidney stones and bladder infxn / pylo. States this is similar just more severe. No fever (but taking tylenol / IBU), constipation, N/V/D, hip pain (was able to do yard work - bending, squatting - this weekend), dysuria, hematuria. + for urine freq. Exam - no CVA tenderness to percussion but + to deep palpation. ABD point tenderness R extreme lower quadrant just above inguinal canal.   Diff Dx :  ortho / OA of hip - unlikely as not worsening with yard work pylo - possible, trace LE and h/o UTI / pylo. Empiric Cipro 250 for three days and check urine cx.  Kidney stone - unlikely - only trace hgB on dip.  GI - abscess / divertic dz - unlikely, no D / fever and started as flank pain.  Hydrocodone for pain. Cipro to cover UTI Check on U/S results ER if declines over weekend

## 2011-09-08 LAB — URINE CULTURE: Organism ID, Bacteria: NO GROWTH

## 2011-09-09 NOTE — Progress Notes (Signed)
Addended by: Remus Blake on: 09/09/2011 09:04 AM   Modules accepted: Orders

## 2011-09-09 NOTE — Progress Notes (Signed)
Addended by: Remus Blake on: 09/09/2011 09:05 AM   Modules accepted: Orders

## 2011-09-09 NOTE — Progress Notes (Signed)
Addended by: Remus Blake on: 09/09/2011 09:06 AM   Modules accepted: Orders

## 2011-09-09 NOTE — Progress Notes (Signed)
Addended by: Remus Blake on: 09/09/2011 09:07 AM   Modules accepted: Orders

## 2011-09-10 ENCOUNTER — Telehealth: Payer: Self-pay | Admitting: Internal Medicine

## 2011-09-10 NOTE — Telephone Encounter (Signed)
Pt's daughter reminded her that just before the pain started she was moving heavy boxes, Then the pain occurred. Now involving her R leg - going up and down the leg. Still in back and R groin area. No weakness, tripping, bowel/bladder sxs. Has h/o L4/L5 DDD - can't find imaging. Hydrocodone helps. Has only used 10. I told her that I would refill if she calls a day or two before she runs out. Explained that this may take 6 weeks to resolve and will resolve slowly. No need for imaging as no red flags. Pt understands.   Informed that urine cx was negative.

## 2011-10-02 ENCOUNTER — Ambulatory Visit (INDEPENDENT_AMBULATORY_CARE_PROVIDER_SITE_OTHER): Payer: Medicaid Other | Admitting: Ophthalmology

## 2011-10-02 ENCOUNTER — Encounter: Payer: Self-pay | Admitting: Ophthalmology

## 2011-10-02 ENCOUNTER — Encounter: Payer: Self-pay | Admitting: Internal Medicine

## 2011-10-02 VITALS — BP 111/74 | HR 94 | Temp 97.4°F | Ht 68.9 in | Wt 143.7 lb

## 2011-10-02 DIAGNOSIS — E538 Deficiency of other specified B group vitamins: Secondary | ICD-10-CM

## 2011-10-02 DIAGNOSIS — R109 Unspecified abdominal pain: Secondary | ICD-10-CM

## 2011-10-02 DIAGNOSIS — M5430 Sciatica, unspecified side: Secondary | ICD-10-CM

## 2011-10-02 MED ORDER — HYDROCODONE-ACETAMINOPHEN 5-500 MG PO TABS: 1.0000 | ORAL_TABLET | Freq: Four times a day (QID) | ORAL | Status: DC | PRN

## 2011-10-02 MED ORDER — CYANOCOBALAMIN 1000 MCG/ML IJ SOLN
1000.0000 ug | Freq: Once | INTRAMUSCULAR | Status: AC
  Administered 2011-10-02: 1000 ug via INTRAMUSCULAR

## 2011-10-02 NOTE — Patient Instructions (Signed)
Please take gabapentin three times a day everyday for the next week. STOP taking flexeril for the next week. Can take hydrocodone for severe pain.
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
## 2011-10-02 NOTE — Assessment & Plan Note (Signed)
Patient given B12 shot today  

## 2011-10-02 NOTE — Assessment & Plan Note (Addendum)
Patient has been evaluated for nephrolithiasis and pyelonephritis with no findings. Her description of the pain is somewhat inconsistent and she seems somewhat animated in playing the sick role. The description of her pain and her hyperesthesia would fit with a radiculopathy and she has known DJD of L4-L5 which may have worsened in the setting of lifting a box. Prescribed vicodin #20, we should not continue this unless we find a source. Ordered lumbar X-ray. May have to consider spine MRI. Unlikely to be hip pathology since she feels fine with standing and walking.

## 2011-10-04 ENCOUNTER — Other Ambulatory Visit: Payer: Self-pay | Admitting: Internal Medicine

## 2011-10-04 ENCOUNTER — Ambulatory Visit (HOSPITAL_COMMUNITY)
Admission: RE | Admit: 2011-10-04 | Discharge: 2011-10-04 | Disposition: A | Discharge status: Home / Self Care | Payer: Medicaid Other | Source: Ambulatory Visit | Attending: Internal Medicine | Admitting: Internal Medicine

## 2011-10-04 DIAGNOSIS — M5431 Sciatica, right side: Secondary | ICD-10-CM | POA: Insufficient documentation

## 2011-10-04 DIAGNOSIS — R109 Unspecified abdominal pain: Secondary | ICD-10-CM

## 2011-10-04 DIAGNOSIS — M4120 Other idiopathic scoliosis, site unspecified: Secondary | ICD-10-CM | POA: Insufficient documentation

## 2011-10-04 DIAGNOSIS — M549 Dorsalgia, unspecified: Secondary | ICD-10-CM | POA: Insufficient documentation

## 2011-10-04 MED ORDER — LORAZEPAM 1 MG PO TABS: ORAL_TABLET | ORAL | Status: AC

## 2011-10-04 NOTE — Progress Notes (Signed)
Pt with 6 weeks of severe R sided flank, hip, groin pain that radiates down leg. Started after lifting heavy box. Conservative tx not helping. Plain film showed degenerative changes. Will proceed with MRI lumbar spine 2/2 length of pain and severity and negative W/U to date. Pt states claustrophobic and must be "put to sleep" to tolerate the MRI. Reviewed all notes prior to brain MRI in 2010 and there was no indication of sedation prior to study. Pt may refuse study unless I "put her to sleep" but she cannot tell me what was used to "put her to sleep" only that it wasn't a pill 1 hour prior to the study. For now, will Rx Ativan 1 mg 1 hour prior to test and inform her I was unable to find anything, inc ativan, that was used prior to the last study.

## 2011-10-09 ENCOUNTER — Encounter: Payer: Self-pay | Admitting: Ophthalmology

## 2011-10-09 ENCOUNTER — Ambulatory Visit (INDEPENDENT_AMBULATORY_CARE_PROVIDER_SITE_OTHER): Payer: Medicaid Other | Admitting: Ophthalmology

## 2011-10-09 VITALS — BP 98/61 | HR 85 | Temp 97.6°F | Ht 68.9 in | Wt 144.3 lb

## 2011-10-09 DIAGNOSIS — M5430 Sciatica, unspecified side: Secondary | ICD-10-CM

## 2011-10-09 DIAGNOSIS — R109 Unspecified abdominal pain: Secondary | ICD-10-CM

## 2011-10-09 DIAGNOSIS — R1031 Right lower quadrant pain: Secondary | ICD-10-CM | POA: Insufficient documentation
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
## 2011-10-09 NOTE — Assessment & Plan Note (Signed)
Patient has 14 lb weight loss in past year, she has been referred for colonoscopy but we need to make sure this does not slip through the cracks. Her pain after eating is concerning for duodenal ulcer or possibly even ovarian cancer. Can consider endoscopy or CT if it does not improve. Patient is not PPI, will consider starting this at next visit.

## 2011-10-09 NOTE — Assessment & Plan Note (Addendum)
Scheduled for MRI 5/15, will be seen in clinic 5/17 when read performed. Ativan 1mg  prescribed as premedication already by Dr. Rogelia Boga. Venita Sheffield is arranging for pre-authorization through medicaid. If patient does not have nerve injury on MRI would consider alternative therapies such as physical therapy, acupuncture, or TENS unit. She seems very open to these therapies.

## 2011-10-09 NOTE — Patient Instructions (Signed)
-  Please take ativan pill 1 hour before your test. -get MRI on 5/15 at 12:00 noon, arrive at 11:00am

## 2011-10-11 ENCOUNTER — Other Ambulatory Visit (HOSPITAL_COMMUNITY): Payer: Medicaid Other

## 2011-10-16 ENCOUNTER — Telehealth: Payer: Self-pay | Admitting: Ophthalmology

## 2011-10-16 ENCOUNTER — Other Ambulatory Visit: Payer: Self-pay | Admitting: *Deleted

## 2011-10-16 ENCOUNTER — Other Ambulatory Visit (HOSPITAL_COMMUNITY): Payer: Medicaid Other

## 2011-10-16 DIAGNOSIS — R109 Unspecified abdominal pain: Secondary | ICD-10-CM

## 2011-10-16 NOTE — Telephone Encounter (Signed)
After calling to get authorization for MRI, called patient to make sure that she was able to get exam. She is out of town and was upset at me since she thought that her MRI was at another time.She says that they might as well schedule it for the first week in June since she is out of town.

## 2011-10-17 ENCOUNTER — Other Ambulatory Visit: Payer: Self-pay | Admitting: Internal Medicine

## 2011-10-17 DIAGNOSIS — J45991 Cough variant asthma: Secondary | ICD-10-CM

## 2011-10-17 DIAGNOSIS — M5431 Sciatica, right side: Secondary | ICD-10-CM

## 2011-10-17 MED ORDER — TRAMADOL HCL 50 MG PO TABS: 50.0000 mg | ORAL_TABLET | Freq: Three times a day (TID) | ORAL | Status: DC | PRN

## 2011-10-17 NOTE — Telephone Encounter (Signed)
I will give her a prescription for Tramadol

## 2011-10-21 ENCOUNTER — Encounter: Payer: Medicaid Other | Admitting: Internal Medicine

## 2011-10-29 NOTE — Progress Notes (Signed)
Pt has cancelled and rescheduled GI appt 3 times, the 4th time the appt was not rescheduled ( Feb. - June, 2013) Dorie Rank, RN, 10/29/2011, 6:59P

## 2011-11-04 ENCOUNTER — Other Ambulatory Visit: Payer: Self-pay | Admitting: *Deleted

## 2011-11-04 DIAGNOSIS — R109 Unspecified abdominal pain: Secondary | ICD-10-CM

## 2011-11-06 ENCOUNTER — Inpatient Hospital Stay (HOSPITAL_COMMUNITY): Admission: RE | Admit: 2011-11-06 | Payer: Medicaid Other | Source: Ambulatory Visit

## 2011-11-08 ENCOUNTER — Encounter: Payer: Medicaid Other | Admitting: Internal Medicine

## 2011-11-12 ENCOUNTER — Encounter: Payer: Medicaid Other | Admitting: Internal Medicine

## 2011-11-13 NOTE — Telephone Encounter (Signed)
Unable to reach pt at [phone] since 11/05/11 about not refilling pain med. Recording keep saying phone number is not in servive. Stanton Kidney Yuki Brunsman RN 11/13/11 8:40AM

## 2011-12-02 ENCOUNTER — Encounter: Payer: Medicaid Other | Admitting: Internal Medicine

## 2011-12-30 ENCOUNTER — Ambulatory Visit (INDEPENDENT_AMBULATORY_CARE_PROVIDER_SITE_OTHER): Payer: Medicaid Other | Admitting: *Deleted

## 2011-12-30 DIAGNOSIS — E538 Deficiency of other specified B group vitamins: Secondary | ICD-10-CM

## 2011-12-30 MED ORDER — CYANOCOBALAMIN 1000 MCG/ML IJ SOLN
1000.0000 ug | Freq: Once | INTRAMUSCULAR | Status: AC
  Administered 2011-12-30: 1000 ug via INTRAMUSCULAR

## 2012-01-03 ENCOUNTER — Encounter: Payer: Self-pay | Admitting: Internal Medicine

## 2012-01-03 ENCOUNTER — Inpatient Hospital Stay (HOSPITAL_COMMUNITY): Admission: RE | Admit: 2012-01-03 | Payer: Medicaid Other | Source: Ambulatory Visit

## 2012-01-28 NOTE — Addendum Note (Signed)
Addended by: Neomia Dear on: 01/28/2012 05:55 PM   Modules accepted: Orders

## 2012-02-11 ENCOUNTER — Ambulatory Visit (INDEPENDENT_AMBULATORY_CARE_PROVIDER_SITE_OTHER): Payer: Medicaid Other | Admitting: *Deleted

## 2012-02-11 ENCOUNTER — Other Ambulatory Visit: Payer: Self-pay | Admitting: *Deleted

## 2012-02-11 DIAGNOSIS — J45991 Cough variant asthma: Secondary | ICD-10-CM

## 2012-02-11 DIAGNOSIS — E538 Deficiency of other specified B group vitamins: Secondary | ICD-10-CM

## 2012-02-11 MED ORDER — CYANOCOBALAMIN 1000 MCG/ML IJ SOLN: 1000.0000 ug | Freq: Once | INTRAMUSCULAR | Status: DC

## 2012-03-05 ENCOUNTER — Other Ambulatory Visit: Payer: Self-pay | Admitting: *Deleted

## 2012-03-05 DIAGNOSIS — J45991 Cough variant asthma: Secondary | ICD-10-CM

## 2012-03-05 NOTE — Telephone Encounter (Signed)
Needs appointment prior to refill, thanks.

## 2012-03-06 ENCOUNTER — Telehealth: Payer: Self-pay | Admitting: *Deleted

## 2012-03-06 NOTE — Telephone Encounter (Signed)
Pt returns call from message 10/3, she is quite upset about not having tramadol refilled, states she must have it to be able to function. States she is going out of town for 2 weeks and needs to be seen, it was explained that she would need an appt w/ her pcp but there is nothing available in the near future w/ him, she is scheduled for mon 10/7 at 1445 w/ dr brown per chilonb.

## 2012-03-06 NOTE — Telephone Encounter (Signed)
appt scheduled for mon

## 2012-03-09 ENCOUNTER — Ambulatory Visit (INDEPENDENT_AMBULATORY_CARE_PROVIDER_SITE_OTHER): Payer: Medicaid Other | Admitting: Internal Medicine

## 2012-03-09 ENCOUNTER — Encounter: Payer: Self-pay | Admitting: Internal Medicine

## 2012-03-09 VITALS — BP 115/66 | HR 98 | Temp 98.6°F | Ht 68.0 in | Wt 146.7 lb

## 2012-03-09 DIAGNOSIS — E538 Deficiency of other specified B group vitamins: Secondary | ICD-10-CM

## 2012-03-09 DIAGNOSIS — Z13820 Encounter for screening for osteoporosis: Secondary | ICD-10-CM

## 2012-03-09 DIAGNOSIS — Z299 Encounter for prophylactic measures, unspecified: Secondary | ICD-10-CM

## 2012-03-09 DIAGNOSIS — R5383 Other fatigue: Secondary | ICD-10-CM

## 2012-03-09 DIAGNOSIS — M5136 Other intervertebral disc degeneration, lumbar region: Secondary | ICD-10-CM | POA: Insufficient documentation

## 2012-03-09 DIAGNOSIS — IMO0002 Reserved for concepts with insufficient information to code with codable children: Secondary | ICD-10-CM

## 2012-03-09 DIAGNOSIS — J45991 Cough variant asthma: Secondary | ICD-10-CM

## 2012-03-09 HISTORY — DX: Reserved for concepts with insufficient information to code with codable children: IMO0002

## 2012-03-09 LAB — CBC
HCT: 33.5 % — ABNORMAL LOW (ref 36.0–46.0)
MCH: 32.9 pg (ref 26.0–34.0)
MCHC: 33.4 g/dL (ref 30.0–36.0)
MCV: 98.5 fL (ref 78.0–100.0)
RDW: 13.8 % (ref 11.5–15.5)
WBC: 5.4 10*3/uL (ref 4.0–10.5)

## 2012-03-09 LAB — IRON AND TIBC
Iron: 111 ug/dL (ref 42–145)
TIBC: 375 ug/dL (ref 250–470)
UIBC: 264 ug/dL (ref 125–400)

## 2012-03-09 MED ORDER — VITAMIN B-12 1000 MCG PO TABS: 1000.0000 ug | ORAL_TABLET | Freq: Every day | ORAL | Status: DC

## 2012-03-09 MED ORDER — TRAMADOL HCL 50 MG PO TABS: 50.0000 mg | ORAL_TABLET | Freq: Three times a day (TID) | ORAL | Status: DC | PRN

## 2012-03-09 MED ORDER — CYANOCOBALAMIN 1000 MCG/ML IJ SOLN
1000.0000 ug | Freq: Once | INTRAMUSCULAR | Status: AC
  Administered 2012-03-09: 1000 ug via INTRAMUSCULAR

## 2012-03-09 NOTE — Assessment & Plan Note (Signed)
-  DEXA scan ordered -patient refused flu shot at this time (about to travel, worry that vaccine may give her the flu), but would like to have at her next visit

## 2012-03-09 NOTE — Progress Notes (Signed)
HPI The patient is a 66 y.o. yo female with a history of asthma, vitamin B12 deficiency, and DDD, presenting for an acute visit for pain management.  The patient has a history of DDD, as evidenced most recently on x-ray spine May 2013.  The pain prevents the patient from enjoying her ADL's, including walking and going up stairs.  The patient takes Tramadol for this pain, which allows her to perform her ADL's.  She has no red flag behaviors.  The patient also takes gabapentin twice per day for neuropathy.  The patient notes progressive fatigue over the last 1-2 months.  She notes significant life stress during this time, including her son losing his job, and his daughter having "relationship problems".  She has a history of vitamin B12 deficiency, and is due for her B12 injection today.  She notes no weight loss, change in appetite, night sweats.  ROS: General: no fevers, chills, changes in weight, changes in appetite Skin: no rash HEENT: no blurry vision, hearing changes, sore throat Pulm: no dyspnea, coughing, wheezing CV: no chest pain, palpitations, shortness of breath Abd: no abdominal pain, nausea/vomiting, diarrhea/constipation GU: no dysuria, hematuria, polyuria Ext: no arthralgias, myalgias Neuro: no weakness, numbness, or tingling  Filed Vitals:   03/09/12 1420  BP: 115/66  Pulse: 98  Temp: 98.6 F (37 C)    PEX General: alert, cooperative, and in no apparent distress HEENT: pupils equal round and reactive to light, vision grossly intact, oropharynx clear and non-erythematous  Neck: supple, no lymphadenopathy Lungs: clear to ascultation bilaterally, normal work of respiration, no wheezes, rales, ronchi Heart: regular rate and rhythm, no murmurs, gallops, or rubs Abdomen: soft, non-tender, non-distended, normal bowel sounds Extremities: no cyanosis, clubbing, or edema Neurologic: alert & oriented X3, cranial nerves II-XII intact, strength grossly intact, sensation intact to  light touch  Current Outpatient Prescriptions on File Prior to Visit  Medication Sig Dispense Refill  . albuterol (PROVENTIL HFA) 108 (90 BASE) MCG/ACT inhaler Inhale 2 puffs into the lungs every 6 (six) hours as needed for wheezing.  1 Inhaler  6  . aspirin 81 MG tablet Take 1 tablet (81 mg total) by mouth daily.  30 tablet  3  . cyanocobalamin 1000 MCG tablet Inject 100 mcg into the skin every 30 (thirty) days.        . cyclobenzaprine (FLEXERIL) 5 MG tablet Take 1 tablet (5 mg total) by mouth at bedtime as needed.  30 tablet  1  . ergocalciferol (VITAMIN D2) 50000 UNITS capsule Take 1 capsule (50,000 Units total) by mouth once a week.  4 capsule  1  . gabapentin (NEURONTIN) 300 MG capsule Take 1 capsule (300 mg total) by mouth 3 (three) times daily.  90 capsule  3  . HYDROcodone-acetaminophen (VICODIN) 5-500 MG per tablet Take 1 tablet by mouth every 6 (six) hours as needed for pain.  20 tablet  0  . loratadine-pseudoephedrine (CLARITIN-D 24-HOUR) 10-240 MG per 24 hr tablet Take 1 tablet by mouth daily as needed.        . Tamsulosin HCl (FLOMAX) 0.4 MG CAPS Take 1 capsule (0.4 mg total) by mouth daily.  30 capsule  0  . traMADol (ULTRAM) 50 MG tablet Take 1 tablet (50 mg total) by mouth every 8 (eight) hours as needed.  90 tablet  0    Assessment/Plan

## 2012-03-09 NOTE — Assessment & Plan Note (Addendum)
The patient notes a 1-2 month history of fatigue.  Differential diagnosis includes vit B12 deficiency, anemia, iron deficiency, hypothyroidism, or increased recent life stress. -check vit B12, ferritin/iron/TIBC, TSH, CBC, BMET -B12 injection today  Addendum 03/10/12: Above labs showed a low ferritin of 26.  No prior ferritin for comparison.  The patient is s/p hysterectomy, and has never had a colonoscopy.  For now we will start the patient on PO iron supplementation twice per day.  At the patient's next visit, we can refer for colonoscopy vs give FOBC's to further evaluate her iron deficiency.

## 2012-03-09 NOTE — Patient Instructions (Signed)
We are checking several labs today, including vitamin B12, iron, thyroid function, kidney function, and blood levels (hemoglobin).  We will call you if any results are abnormal.  If you do not hear from [place], you may assume all of the results were normal.  We are giving you a Vitamin B12 shot today.  From now on, we will start you on Vitamin B12 tablets, 1 tablet per day.  We recommend a Bone Mineral Density Scan for osteoporosis (aka "weak bones").  If you decide to pursue this option, we can set this up for you.  Please return for a follow-up visit in 5-6 months.

## 2012-03-09 NOTE — Assessment & Plan Note (Signed)
The patient has a history of DDD, seen on imaging.  The patient has been taking Tramadol, with no red flag behaviors. -Tramadol refilled, per pain contract

## 2012-03-09 NOTE — Assessment & Plan Note (Signed)
The patient has a history of B12 deficiency, intrinsic factor antibody negative -check vit B12 level today -B12 injection today -will change from B12 injections to B12 tablets from now on

## 2012-03-10 LAB — COMPLETE METABOLIC PANEL WITH GFR
AST: 13 U/L (ref 0–37)
BUN: 12 mg/dL (ref 6–23)
Calcium: 9.4 mg/dL (ref 8.4–10.5)
Chloride: 104 mEq/L (ref 96–112)
Creat: 0.81 mg/dL (ref 0.50–1.10)
Total Bilirubin: 0.4 mg/dL (ref 0.3–1.2)

## 2012-03-10 LAB — VITAMIN B12: Vitamin B-12: 408 pg/mL (ref 211–911)

## 2012-03-10 LAB — FERRITIN: Ferritin: 26 ng/mL (ref 10–291)

## 2012-03-10 MED ORDER — FERROUS SULFATE 325 (65 FE) MG PO TABS: 325.0000 mg | ORAL_TABLET | Freq: Two times a day (BID) | ORAL | Status: DC

## 2012-03-10 NOTE — Addendum Note (Signed)
Addended by: Linward Headland on: 03/10/2012 04:12 PM   Modules accepted: Orders

## 2012-03-18 ENCOUNTER — Ambulatory Visit (HOSPITAL_COMMUNITY): Admission: RE | Admit: 2012-03-18 | Payer: Medicaid Other | Source: Ambulatory Visit

## 2012-03-20 ENCOUNTER — Ambulatory Visit (HOSPITAL_COMMUNITY): Admission: RE | Admit: 2012-03-20 | Payer: Medicaid Other | Source: Ambulatory Visit

## 2012-04-17 ENCOUNTER — Encounter: Payer: Self-pay | Admitting: Internal Medicine

## 2012-04-17 ENCOUNTER — Other Ambulatory Visit: Payer: Self-pay | Admitting: Internal Medicine

## 2012-04-21 ENCOUNTER — Other Ambulatory Visit: Payer: Self-pay | Admitting: Internal Medicine

## 2012-04-21 DIAGNOSIS — J45991 Cough variant asthma: Secondary | ICD-10-CM

## 2012-04-24 ENCOUNTER — Other Ambulatory Visit: Payer: Self-pay | Admitting: Internal Medicine

## 2012-04-24 ENCOUNTER — Encounter: Payer: Self-pay | Admitting: Internal Medicine

## 2012-04-24 DIAGNOSIS — M791 Myalgia, unspecified site: Secondary | ICD-10-CM

## 2012-04-24 DIAGNOSIS — E559 Vitamin D deficiency, unspecified: Secondary | ICD-10-CM

## 2012-04-24 DIAGNOSIS — J45991 Cough variant asthma: Secondary | ICD-10-CM

## 2012-04-24 MED ORDER — GABAPENTIN 300 MG PO CAPS: 300.0000 mg | ORAL_CAPSULE | Freq: Three times a day (TID) | ORAL | Status: DC

## 2012-04-24 MED ORDER — ASPIRIN 81 MG PO TABS: 81.0000 mg | ORAL_TABLET | Freq: Every day | ORAL | Status: DC

## 2012-07-27 ENCOUNTER — Encounter: Payer: Self-pay | Admitting: Internal Medicine

## 2012-07-27 ENCOUNTER — Ambulatory Visit (INDEPENDENT_AMBULATORY_CARE_PROVIDER_SITE_OTHER): Payer: Self-pay | Admitting: Internal Medicine

## 2012-07-27 VITALS — BP 111/72 | HR 74 | Temp 97.8°F | Ht 68.0 in | Wt 141.9 lb

## 2012-07-27 DIAGNOSIS — M791 Myalgia, unspecified site: Secondary | ICD-10-CM

## 2012-07-27 DIAGNOSIS — E538 Deficiency of other specified B group vitamins: Secondary | ICD-10-CM

## 2012-07-27 DIAGNOSIS — Z0000 Encounter for general adult medical examination without abnormal findings: Secondary | ICD-10-CM

## 2012-07-27 DIAGNOSIS — S92502A Displaced unspecified fracture of left lesser toe(s), initial encounter for closed fracture: Secondary | ICD-10-CM

## 2012-07-27 DIAGNOSIS — J45991 Cough variant asthma: Secondary | ICD-10-CM

## 2012-07-27 DIAGNOSIS — S025XXA Fracture of tooth (traumatic), initial encounter for closed fracture: Secondary | ICD-10-CM

## 2012-07-27 DIAGNOSIS — B1910 Unspecified viral hepatitis B without hepatic coma: Secondary | ICD-10-CM

## 2012-07-27 DIAGNOSIS — S92919A Unspecified fracture of unspecified toe(s), initial encounter for closed fracture: Secondary | ICD-10-CM

## 2012-07-27 DIAGNOSIS — W19XXXA Unspecified fall, initial encounter: Secondary | ICD-10-CM

## 2012-07-27 LAB — HEPATITIS B SURFACE ANTIGEN: Hepatitis B Surface Ag: NEGATIVE

## 2012-07-27 MED ORDER — CYANOCOBALAMIN 1000 MCG/ML IJ SOLN
1000.0000 ug | Freq: Once | INTRAMUSCULAR | Status: AC
  Administered 2012-07-27: 1000 ug via INTRAMUSCULAR

## 2012-07-27 MED ORDER — TRAMADOL HCL 50 MG PO TABS: 50.0000 mg | ORAL_TABLET | Freq: Three times a day (TID) | ORAL | Status: DC | PRN

## 2012-07-27 MED ORDER — GABAPENTIN 300 MG PO CAPS: 300.0000 mg | ORAL_CAPSULE | Freq: Three times a day (TID) | ORAL | Status: DC

## 2012-07-27 NOTE — Patient Instructions (Addendum)
General Instructions: -Follow up with your dentist for your chipped tooth. You do not need antibiotics today but call us if your gum is red/swollen or the pain is worse.  -You need a screening colonoscopy. We will schedule it for May -You need a Dexa scan of your bones. We will schedule this for the end of May or beginning of June -It wast great meeting you!     Treatment Goals:  Goals (1 Years of Data) as of 07/27/12         As of Today 03/09/12 10/09/11 10/02/11 09/04/11     Blood Pressure    . Blood Pressure < 140/90  144/84 115/66 98/61 111/74 131/73      Self Care Goals & Plans:  -Continue taking your medications as prescribed

## 2012-07-28 LAB — HEPATITIS B CORE ANTIBODY, TOTAL: Hep B Core Total Ab: POSITIVE — AB

## 2012-07-28 LAB — HEPATITIS B SURFACE ANTIBODY,QUALITATIVE: Hep B S Ab: REACTIVE — AB

## 2012-07-28 NOTE — Progress Notes (Signed)
  Subjective:    Patient ID: Rachel Murray, female    DOB: [birth year], 67 y.o.   MRN: [id number]  HPI Ms. Campau is a 67 year old woman with PMH of B12 deficiency, asthma, hepatitis B, and degenerative disc disease who comes in for evaluation of left 2nd toe injury and chipping of upper incisive secondary to a recent mechanical fall. She explains that she was in a hurry and her foot was trapped leading to a mechanical fall when she fell down face forward. Her left foot was initially swollen and with dark discoloration at the dorsum but the swelling is now gone with dark discoloration localized to the left 2nd toe. She tapped her left toes together for a few days and has had improvement of toe pain with Ultram. She has buccal irritation near the upper chipped incisive but denies decrease intake per mouth.    Review of Systems  Constitutional: Negative for fever, chills, diaphoresis, activity change, appetite change, fatigue and unexpected weight change.  HENT: Positive for dental problem. Negative for facial swelling and mouth sores.   Respiratory: Negative for cough and shortness of breath.   Cardiovascular: Negative for chest pain.  Musculoskeletal: Positive for arthralgias.  Skin: Positive for color change. Negative for pallor, rash and wound.       seborrheic keratosis on abdomen  Dark discoloration of left 2nd tarsal  Neurological: Negative for dizziness, syncope, weakness and light-headedness.  Hematological: Negative for adenopathy. Does not bruise/bleed easily.  Psychiatric/Behavioral: Negative for behavioral problems and agitation.       Objective:   Physical Exam  Nursing note and vitals reviewed. Constitutional: She is oriented to person, place, and time. She appears well-developed and well-nourished. No distress.  HENT:  Head: Atraumatic.  Nose: Nose normal.  Mouth/Throat: Oropharynx is clear and moist. No oropharyngeal exudate.  Chipped left upper incisor with caries. No  surrounding edema, erythema, or purulent discharge with mild tenderness to percussion  Cardiovascular: Normal rate and regular rhythm.   Pulmonary/Chest: Effort normal and breath sounds normal. No respiratory distress. She has no wheezes. She has no rales. She exhibits no tenderness.  Musculoskeletal: Normal range of motion. She exhibits tenderness. She exhibits no edema.  Tenderness of left 2nd toe with dark discoloration.   Neurological: She is alert and oriented to person, place, and time.  Skin: Skin is warm and dry. No rash noted. She is not diaphoretic. No erythema. No pallor.  Psychiatric: She has a normal mood and affect. Her behavior is normal.          Assessment & Plan:

## 2012-07-29 DIAGNOSIS — S92502A Displaced unspecified fracture of left lesser toe(s), initial encounter for closed fracture: Secondary | ICD-10-CM | POA: Insufficient documentation

## 2012-07-29 DIAGNOSIS — S025XXA Fracture of tooth (traumatic), initial encounter for closed fracture: Secondary | ICD-10-CM | POA: Insufficient documentation

## 2012-07-29 NOTE — Assessment & Plan Note (Signed)
Last CMP with nl LFT[facility]. She has no complaints of abdominal pain.  -Repeat Hepatitis B panel.

## 2012-07-29 NOTE — Assessment & Plan Note (Signed)
No signs of infection or buccal injury on exam. No indication for antibiotic treatment at this time.  -referral to Dentist for this week

## 2012-07-29 NOTE — Assessment & Plan Note (Signed)
Secondary to a mechanical fall while she was in [place]. She was seen at the local Urgent Care. She states that the swelling, pain, and discoloration are improving. She has taken Ultram with good relief of her toe pain.  -Continue monitoring

## 2012-07-29 NOTE — Assessment & Plan Note (Signed)
B12 injection given during this visit.

## 2012-07-29 NOTE — Assessment & Plan Note (Addendum)
-  She agrees to colonoscopy in May, after she has attended important family events.  -She declines flu vaccine  -DEXA scan to be scheduled after her birthday (age 67) for Medicare coverage

## 2012-07-31 LAB — HEPATITIS B DNA, ULTRAQUANTITATIVE, PCR

## 2012-08-10 ENCOUNTER — Encounter: Payer: Self-pay | Admitting: Internal Medicine

## 2012-08-13 ENCOUNTER — Encounter: Payer: Self-pay | Admitting: Internal Medicine

## 2012-08-13 ENCOUNTER — Other Ambulatory Visit: Payer: Self-pay | Admitting: Internal Medicine

## 2012-08-26 ENCOUNTER — Telehealth: Payer: Self-pay | Admitting: Internal Medicine

## 2012-08-26 NOTE — Telephone Encounter (Signed)
Patient was mailed a letter on 08-10-12 asking her to call and schedule a HM visit with PCP.  The letter was returned on 08/26/12 due to incorrect mailing address.

## 2012-09-09 ENCOUNTER — Encounter: Payer: Self-pay | Admitting: Internal Medicine

## 2012-09-09 DIAGNOSIS — R269 Unspecified abnormalities of gait and mobility: Secondary | ICD-10-CM | POA: Insufficient documentation

## 2012-10-15 ENCOUNTER — Encounter: Payer: Self-pay | Admitting: Internal Medicine

## 2012-10-16 ENCOUNTER — Ambulatory Visit: Payer: Self-pay | Admitting: Internal Medicine

## 2012-12-16 ENCOUNTER — Ambulatory Visit (INDEPENDENT_AMBULATORY_CARE_PROVIDER_SITE_OTHER): Payer: Medicare Other | Admitting: Internal Medicine

## 2012-12-16 ENCOUNTER — Encounter: Payer: Self-pay | Admitting: Internal Medicine

## 2012-12-16 VITALS — BP 108/68 | HR 74 | Temp 97.6°F | Ht 66.5 in | Wt 137.6 lb

## 2012-12-16 DIAGNOSIS — R269 Unspecified abnormalities of gait and mobility: Secondary | ICD-10-CM | POA: Diagnosis not present

## 2012-12-16 DIAGNOSIS — Z0000 Encounter for general adult medical examination without abnormal findings: Secondary | ICD-10-CM

## 2012-12-16 DIAGNOSIS — M5430 Sciatica, unspecified side: Secondary | ICD-10-CM | POA: Diagnosis not present

## 2012-12-16 DIAGNOSIS — R252 Cramp and spasm: Secondary | ICD-10-CM

## 2012-12-16 DIAGNOSIS — IMO0001 Reserved for inherently not codable concepts without codable children: Secondary | ICD-10-CM

## 2012-12-16 DIAGNOSIS — E538 Deficiency of other specified B group vitamins: Secondary | ICD-10-CM | POA: Diagnosis not present

## 2012-12-16 DIAGNOSIS — M5431 Sciatica, right side: Secondary | ICD-10-CM

## 2012-12-16 MED ORDER — TRAMADOL HCL 50 MG PO TABS: 50.0000 mg | ORAL_TABLET | Freq: Three times a day (TID) | ORAL | Status: DC | PRN

## 2012-12-16 MED ORDER — GABAPENTIN 300 MG PO CAPS: 300.0000 mg | ORAL_CAPSULE | Freq: Three times a day (TID) | ORAL | Status: DC

## 2012-12-16 NOTE — Assessment & Plan Note (Addendum)
Pt needs a pap smear she had a partial hysterectomy in the past. She states she had one 07/2012 at outside facility trying to get records  Pt also need colonoscopy

## 2012-12-16 NOTE — Assessment & Plan Note (Signed)
Patient asked for B12 shot today.   Advised patient she can take tablet daily 1000 mcg.  She will try

## 2012-12-16 NOTE — Assessment & Plan Note (Signed)
Rx refill Ultram and Tramadol tolerating both

## 2012-12-16 NOTE — Assessment & Plan Note (Signed)
Will check BMET today to check potassium

## 2012-12-16 NOTE — Progress Notes (Signed)
  Subjective:    Patient ID: Rachel Murray, female    DOB: 1945/07/01, 67 y.o.   MRN: [id number]  HPI Comments: 67 y.o woman PMH B12 deficiency, asthma, sciatica, DDD (lumbar spine and patellofemoral), abnormality of gait.  She presents for Rx refill of Ultram and Gabapentin which help with pains in her back and legs.  She states she had a fall in [place] in 07/2012 and was seen at the hospital in [facility] and had a CT scan and pelvic exam done.  Her pains were aggravated during this time after the fall in 07/2012 but now she has improved pain in her right leg.  She mentions too she broke her left 2nd toe during the fall.  Her pains are worse with sitting still and laying over 7 hours so she tries to stay active.  Sensation in her legs feels like "critters stinging/hurting".    Patient also mentions she gets muscle cramps  She requests a B12 shot today as well.    FH: sister died with breast cancer and daughter diagnosed with rectal cancer.    ROS per HPI      Review of Systems     Objective:   Physical Exam  Nursing note and vitals reviewed. Constitutional: She is oriented to person, place, and time. Vital signs are normal. She appears well-developed and well-nourished. She is cooperative. No distress.  Very talkative but nice   HENT:  Head: Normocephalic and atraumatic.  Mouth/Throat: Oropharynx is clear and moist and mucous membranes are normal. Abnormal dentition. No oropharyngeal exudate.  Eyes: Conjunctivae are normal. Pupils are equal, round, and reactive to light. Right eye exhibits no discharge. Left eye exhibits no discharge. No scleral icterus.  Cardiovascular: Normal rate, regular rhythm, S1 normal, S2 normal and normal heart sounds.   No murmur heard. Pulmonary/Chest: Effort normal and breath sounds normal. No respiratory distress. She has no wheezes.  Musculoskeletal:  No ttp lower back or right hip    Neurological: She is alert and oriented to person, place,  and time.  Slight limp with walking and baseline walks with a cane   Skin: Skin is warm, dry and intact. No rash noted. She is not diaphoretic.  Psychiatric: She has a normal mood and affect. Her speech is normal and behavior is normal. Judgment and thought content normal. Cognition and memory are normal.          Assessment & Plan:  F/u in 3-6 months

## 2012-12-16 NOTE — Patient Instructions (Addendum)
Follow up in 3-6 months with regular doctor sooner if needed   Fall Prevention Falls are the leading cause of injuries, accidents, and accidental deaths in people over the age of 32. Falling is a real threat to your ability to live on your own. CAUSES   Poor eyesight or poor hearing can make you more likely to fall.   Illnesses and physical conditions can affect your strength and balance.   Poor lighting, throw rugs and pets in your home can make you more likely to trip or slip.   The side effects of some medicines can upset your balance and lead to falling. These include medicines for depression, sleep problems, high blood pressure, diabetes, and heart conditions.  PREVENTION  Be sure your home is as safe as possible. Here are some tips:  Wear shoes with non-skid soles (not house slippers).   Be sure your home and outside area are well lit.   Use night lights throughout your house, including hallways and stairways.   Remove clutter and clean up spills on floors and walkways.   Remove throw rugs or fasten them to the floor with carpet tape. Tack down carpet edges.   Do not place electrical cords across pathways.   Install grab bars in your bathtub, shower, and toilet area. Towel bars should not be used as a grab bar.   Install handrails on both sides of stairways.   Do not climb on stools or stepladders. Get someone else to help with jobs that require climbing.   Do not wax your floors at all, or use a non-skid wax.   Repair uneven or unsafe sidewalks, walkways or stairs.   Keep frequently used items within reach.   Be aware of pets so you do not trip.  Get regular check-ups from your doctor, and take good care of yourself:  Have your eyes checked every year for vision changes, cataracts, glaucoma, and other eye problems. Wear eyeglasses as directed.   Have your hearing checked every 2 years, or anytime you or others think that you cannot hear well. Use hearing aids as  directed.   See your caregiver if you have foot pain or corns. Sore feet can contribute to falls.   Let your caregiver know if a medicine is making you feel dizzy or making you lose your balance.   Use a cane, walker, or wheelchair as directed. Use walker or wheelchair brakes when getting in and out.   When you get up from bed, sit on the side of the bed for 1 to 2 minutes before you stand up. This will give your blood pressure time to adjust, and you will feel less dizzy.   If you need to go to the bathroom often, consider using a bedside commode.  Keep your body in good shape:  Get regular exercise, especially walking.   Do exercises to strengthen the muscles you use for walking and lifting.   Do not smoke.   Minimize use of alcohol.  SEEK IMMEDIATE MEDICAL CARE IF:   You feel dizzy, weak, or unsteady on your feet.   You feel confused.   You fall.  Document Released: 05/20/2005 Document Revised: 05/09/2011 Document Reviewed: 11/14/2006 [facility] Patient Information 2012 [place], [place]. Fall Prevention and Home Safety Falls cause injuries and can affect all age groups. It is possible to use preventive measures to significantly decrease the likelihood of falls. There are many simple measures which can make your home safer and prevent falls. OUTDOORS  Repair cracks and edges of walkways and driveways.  Remove high doorway thresholds.  Trim shrubbery on the main path into your home.  Have good outside lighting.  Clear walkways of tools, rocks, debris, and clutter.  Check that handrails are not broken and are securely fastened. Both sides of steps should have handrails.  Have leaves, snow, and ice cleared regularly.  Use sand or salt on walkways during winter months.  In the garage, clean up grease or oil spills. BATHROOM  Install night lights.  Install grab bars by the toilet and in the tub and shower.  Use non-skid mats or decals in the tub or shower.  Place a  plastic non-slip stool in the shower to sit on, if needed.  Keep floors dry and clean up all water on the floor immediately.  Remove soap buildup in the tub or shower on a regular basis.  Secure bath mats with non-slip, double-sided rug tape.  Remove throw rugs and tripping hazards from the floors. BEDROOMS  Install night lights.  Make sure a bedside light is easy to reach.  Do not use oversized bedding.  Keep a telephone by your bedside.  Have a firm chair with side arms to use for getting dressed.  Remove throw rugs and tripping hazards from the floor. KITCHEN  Keep handles on pots and pans turned toward the center of the stove. Use back burners when possible.  Clean up spills quickly and allow time for drying.  Avoid walking on wet floors.  Avoid hot utensils and knives.  Position shelves so they are not too high or low.  Place commonly used objects within easy reach.  If necessary, use a sturdy step stool with a grab bar when reaching.  Keep electrical cables out of the way.  Do not use floor polish or wax that makes floors slippery. If you must use wax, use non-skid floor wax.  Remove throw rugs and tripping hazards from the floor. STAIRWAYS  Never leave objects on stairs.  Place handrails on both sides of stairways and use them. Fix any loose handrails. Make sure handrails on both sides of the stairways are as long as the stairs.  Check carpeting to make sure it is firmly attached along stairs. Make repairs to worn or loose carpet promptly.  Avoid placing throw rugs at the top or bottom of stairways, or properly secure the rug with carpet tape to prevent slippage. Get rid of throw rugs, if possible.  Have an electrician put in a light switch at the top and bottom of the stairs. OTHER FALL PREVENTION TIPS  Wear low-heel or rubber-soled shoes that are supportive and fit well. Wear closed toe shoes.  When using a stepladder, make sure it is fully opened  and both spreaders are firmly locked. Do not climb a closed stepladder.  Add color or contrast paint or tape to grab bars and handrails in your home. Place contrasting color strips on first and last steps.  Learn and use mobility aids as needed. Install an electrical emergency response system.  Turn on lights to avoid dark areas. Replace light bulbs that burn out immediately. Get light switches that glow.  Arrange furniture to create clear pathways. Keep furniture in the same place.  Firmly attach carpet with non-skid or double-sided tape.  Eliminate uneven floor surfaces.  Select a carpet pattern that does not visually hide the edge of steps.  Be aware of all pets. OTHER HOME SAFETY TIPS  Set the water temperature  for 120 F (48.8 C).  Keep emergency numbers on or near the telephone.  Keep smoke detectors on every level of the home and near sleeping areas. Document Released: 05/10/2002 Document Revised: 11/19/2011 Document Reviewed: 08/09/2011 [facility] Patient Information 2014 [place], [place].  Sciatica Sciatica is pain, weakness, numbness, or tingling along the path of the sciatic nerve. The nerve starts in the lower back and runs down the back of each leg. The nerve controls the muscles in the lower leg and in the back of the knee, while also providing sensation to the back of the thigh, lower leg, and the sole of your foot. Sciatica is a symptom of another medical condition. For instance, nerve damage or certain conditions, such as a herniated disk or bone spur on the spine, pinch or put pressure on the sciatic nerve. This causes the pain, weakness, or other sensations normally associated with sciatica. Generally, sciatica only affects one side of the body. CAUSES   Herniated or slipped disc.  Degenerative disk disease.  A pain disorder involving the narrow muscle in the buttocks (piriformis syndrome).  Pelvic injury or fracture.  Pregnancy.  Tumor (rare). SYMPTOMS    Symptoms can vary from mild to very severe. The symptoms usually travel from the low back to the buttocks and down the back of the leg. Symptoms can include:  Mild tingling or dull aches in the lower back, leg, or hip.  Numbness in the back of the calf or sole of the foot.  Burning sensations in the lower back, leg, or hip.  Sharp pains in the lower back, leg, or hip.  Leg weakness.  Severe back pain inhibiting movement. These symptoms may get worse with coughing, sneezing, laughing, or prolonged sitting or standing. Also, being overweight may worsen symptoms. DIAGNOSIS  Your caregiver will perform a physical exam to look for common symptoms of sciatica. He or she may ask you to do certain movements or activities that would trigger sciatic nerve pain. Other tests may be performed to find the cause of the sciatica. These may include:  Blood tests.  X-rays.  Imaging tests, such as an MRI or CT scan. TREATMENT  Treatment is directed at the cause of the sciatic pain. Sometimes, treatment is not necessary and the pain and discomfort goes away on its own. If treatment is needed, your caregiver may suggest:  Over-the-counter medicines to relieve pain.  Prescription medicines, such as anti-inflammatory medicine, muscle relaxants, or narcotics.  Applying heat or ice to the painful area.  Steroid injections to lessen pain, irritation, and inflammation around the nerve.  Reducing activity during periods of pain.  Exercising and stretching to strengthen your abdomen and improve flexibility of your spine. Your caregiver may suggest losing weight if the extra weight makes the back pain worse.  Physical therapy.  Surgery to eliminate what is pressing or pinching the nerve, such as a bone spur or part of a herniated disk. HOME CARE INSTRUCTIONS   Only take over-the-counter or prescription medicines for pain or discomfort as directed by your caregiver.  Apply ice to the affected area for  20 minutes, 3 4 times a day for the first 48 72 hours. Then try heat in the same way.  Exercise, stretch, or perform your usual activities if these do not aggravate your pain.  Attend physical therapy sessions as directed by your caregiver.  Keep all follow-up appointments as directed by your caregiver.  Do not wear high heels or shoes that do not provide proper  support.  Check your mattress to see if it is too soft. A firm mattress may lessen your pain and discomfort. SEEK IMMEDIATE MEDICAL CARE IF:   You lose control of your bowel or bladder (incontinence).  You have increasing weakness in the lower back, pelvis, buttocks, or legs.  You have redness or swelling of your back.  You have a burning sensation when you urinate.  You have pain that gets worse when you lie down or awakens you at night.  Your pain is worse than you have experienced in the past.  Your pain is lasting longer than 4 weeks.  You are suddenly losing weight without reason. MAKE SURE YOU:  Understand these instructions.  Will watch your condition.  Will get help right away if you are not doing well or get worse. Document Released: 05/14/2001 Document Revised: 11/19/2011 Document Reviewed: 09/29/2011 [facility] Patient Information 2014 [place], [place].  Back Pain, Adult Low back pain is very common. About 1 in 5 people have back pain.The cause of low back pain is rarely dangerous. The pain often gets better over time.About half of people with a sudden onset of back pain feel better in just 2 weeks. About 8 in 10 people feel better by 6 weeks.  CAUSES Some common causes of back pain include:  Strain of the muscles or ligaments supporting the spine.  Wear and tear (degeneration) of the spinal discs.  Arthritis.  Direct injury to the back. DIAGNOSIS Most of the time, the direct cause of low back pain is not known.However, back pain can be treated effectively even when the exact cause of the pain is  unknown.Answering your caregiver's questions about your overall health and symptoms is one of the most accurate ways to make sure the cause of your pain is not dangerous. If your caregiver needs more information, he or she may order lab work or imaging tests (X-rays or MRIs).However, even if imaging tests show changes in your back, this usually does not require surgery. HOME CARE INSTRUCTIONS For many people, back pain returns.Since low back pain is rarely dangerous, it is often a condition that people can learn to [facility] their own.   Remain active. It is stressful on the back to sit or stand in one place. Do not sit, drive, or stand in one place for more than 30 minutes at a time. Take short walks on level surfaces as soon as pain allows.Try to increase the length of time you walk each day.  Do not stay in bed.Resting more than 1 or 2 days can delay your recovery.  Do not avoid exercise or work.Your body is made to move.It is not dangerous to be active, even though your back may hurt.Your back will likely heal faster if you return to being active before your pain is gone.  Pay attention to your body when you bend and lift. Many people have less discomfortwhen lifting if they bend their knees, keep the load close to their bodies,and avoid twisting. Often, the most comfortable positions are those that put less stress on your recovering back.  Find a comfortable position to sleep. Use a firm mattress and lie on your side with your knees slightly bent. If you lie on your back, put a pillow under your knees.  Only take over-the-counter or prescription medicines as directed by your caregiver. Over-the-counter medicines to reduce pain and inflammation are often the most helpful.Your caregiver may prescribe muscle relaxant drugs.These medicines help dull your pain so you can more  quickly return to your normal activities and healthy exercise.  Put ice on the injured area.  Put ice in a  plastic bag.  Place a towel between your skin and the bag.  Leave the ice on for 15-20 minutes, 3-4 times a day for the first 2 to 3 days. After that, ice and heat may be alternated to reduce pain and spasms.  Ask your caregiver about trying back exercises and gentle massage. This may be of some benefit.  Avoid feeling anxious or stressed.Stress increases muscle tension and can worsen back pain.It is important to recognize when you are anxious or stressed and learn ways to manage it.Exercise is a great option. SEEK MEDICAL CARE IF:  You have pain that is not relieved with rest or medicine.  You have pain that does not improve in 1 week.  You have new symptoms.  You are generally not feeling well. SEEK IMMEDIATE MEDICAL CARE IF:   You have pain that radiates from your back into your legs.  You develop new bowel or bladder control problems.  You have unusual weakness or numbness in your arms or legs.  You develop nausea or vomiting.  You develop abdominal pain.  You feel faint. Document Released: 05/20/2005 Document Revised: 11/19/2011 Document Reviewed: 10/08/2010 [facility] Patient Information 2014 [place], [place].  Gabapentin capsules or tablets What is this medicine? GABAPENTIN (GA ba pen tin) is used to control partial seizures in adults with epilepsy. It is also used to treat certain types of nerve pain. This medicine may be used for other purposes; ask your health care provider or pharmacist if you have questions. What should I tell my health care provider before I take this medicine? They need to know if you have any of these conditions: -kidney disease -suicidal thoughts, plans, or attempt; a previous suicide attempt by you or a family member -an unusual or allergic reaction to gabapentin, other medicines, foods, dyes, or preservatives -pregnant or trying to get pregnant -breast-feeding How should I use this medicine? Take this medicine by mouth. Swallow it with  a drink of water. Follow the directions on the prescription label. If this medicine upsets your stomach, take it with food or milk. Take your medicine at regular intervals. Do not take it more often than directed. If you are directed to break the 600 or 800 mg tablets in half as part of your dose, the extra half tablet should be used for the next dose. If you have not used the extra half tablet within 3 days, it should be thrown away. A special MedGuide will be given to you by the pharmacist with each prescription and refill. Be sure to read this information carefully each time. Talk to your pediatrician regarding the use of this medicine in children. Special care may be needed. Overdosage: If you think you have taken too much of this medicine contact a poison control center or emergency room at once. NOTE: This medicine is only for you. Do not share this medicine with others. What if I miss a dose? If you miss a dose, take it as soon as you can. If it is almost time for your next dose, take only that dose. Do not take double or extra doses. What may interact with this medicine? -antacids -hydrocodone -morphine -naproxen -sevelamer This list may not describe all possible interactions. Give your health care provider a list of all the medicines, herbs, non-prescription drugs, or dietary supplements you use. Also tell them if you smoke, drink  alcohol, or use illegal drugs. Some items may interact with your medicine. What should I watch for while using this medicine? Visit your doctor or health care professional for regular checks on your progress. You may want to keep a record at home of how you feel your condition is responding to treatment. You may want to share this information with your doctor or health care professional at each visit. You should contact your doctor or health care professional if your seizures get worse or if you have any new types of seizures. Do not stop taking this medicine or  any of your seizure medicines unless instructed by your doctor or health care professional. Stopping your medicine suddenly can increase your seizures or their severity. Wear a medical identification bracelet or chain if you are taking this medicine for seizures, and carry a card that lists all your medications. You may get drowsy, dizzy, or have blurred vision. Do not drive, use machinery, or do anything that needs mental alertness until you know how this medicine affects you. To reduce dizzy or fainting spells, do not sit or stand up quickly, especially if you are an older patient. Alcohol can increase drowsiness and dizziness. Avoid alcoholic drinks. Your mouth may get dry. Chewing sugarless gum or sucking hard candy, and drinking plenty of water will help. The use of this medicine may increase the chance of suicidal thoughts or actions. Pay special attention to how you are responding while on this medicine. Any worsening of mood, or thoughts of suicide or dying should be reported to your health care professional right away. Women who become pregnant while using this medicine may enroll in the [place] [employer] Antiepileptic Drug Pregnancy Registry by calling [phone]. This registry collects information about the safety of antiepileptic drug use during pregnancy. What side effects may I notice from receiving this medicine? Side effects that you should report to your doctor or health care professional as soon as possible: -allergic reactions like skin rash, itching or hives, swelling of the face, lips, or tongue -worsening of mood, thoughts or actions of suicide or dying Side effects that usually do not require medical attention (report to your doctor or health care professional if they continue or are bothersome): -constipation -difficulty walking or controlling muscle movements -nausea -slurred speech -tremors -weight gain This list may not describe all possible side effects. Call your doctor  for medical advice about side effects. You may report side effects to FDA at 1-800-FDA-1088. Where should I keep my medicine? Keep out of reach of children. Store at room temperature between 15 and 30 degrees C (59 and 86 degrees F). Throw away any unused medicine after the expiration date. NOTE: This sheet is a summary. It may not cover all possible information. If you have questions about this medicine, talk to your doctor, pharmacist, or health care provider.  2012, [employer]. (01/16/2010 6:06:26 PM) Tramadol tablets What is this medicine? TRAMADOL (TRA ma dole) is a pain reliever. It is used to treat moderate to severe pain in adults. This medicine may be used for other purposes; ask your health care provider or pharmacist if you have questions. What should I tell my health care provider before I take this medicine? They need to know if you have any of these conditions: -brain tumor -depression -drug abuse or addiction -head injury -if you frequently drink alcohol containing drinks -kidney disease or trouble passing urine -liver disease -lung disease, asthma, or breathing problems -seizures or epilepsy -suicidal thoughts, plans, or  attempt; a previous suicide attempt by you or a family member -an unusual or allergic reaction to tramadol, codeine, other medicines, foods, dyes, or preservatives -pregnant or trying to get pregnant -breast-feeding How should I use this medicine? Take this medicine by mouth with a full glass of water. Follow the directions on the prescription label. If the medicine upsets your stomach, take it with food or milk. Do not take more medicine than you are told to take. Talk to your pediatrician regarding the use of this medicine in children. Special care may be needed. Overdosage: If you think you have taken too much of this medicine contact a poison control center or emergency room at once. NOTE: This medicine is only for you. Do not share this  medicine with others. What if I miss a dose? If you miss a dose, take it as soon as you can. If it is almost time for your next dose, take only that dose. Do not take double or extra doses. What may interact with this medicine? Do not take this medicine with any of the following medications: -MAOIs like Carbex, Eldepryl, Marplan, Nardil, and Parnate This medicine may also interact with the following medications: -alcohol or medicines that contain alcohol -antihistamines -benzodiazepines -bupropion -carbamazepine or oxcarbazepine -clozapine -cyclobenzaprine -digoxin -furazolidone -linezolid -medicines for depression, anxiety, or psychotic disturbances -medicines for migraine headache like almotriptan, eletriptan, frovatriptan, naratriptan, rizatriptan, sumatriptan, zolmitriptan -medicines for pain like pentazocine, buprenorphine, butorphanol, meperidine, nalbuphine, and propoxyphene -medicines for sleep -muscle relaxants -naltrexone -phenobarbital -phenothiazines like perphenazine, thioridazine, chlorpromazine, mesoridazine, fluphenazine, prochlorperazine, promazine, and trifluoperazine -procarbazine -warfarin This list may not describe all possible interactions. Give your health care provider a list of all the medicines, herbs, non-prescription drugs, or dietary supplements you use. Also tell them if you smoke, drink alcohol, or use illegal drugs. Some items may interact with your medicine. What should I watch for while using this medicine? Tell your doctor or health care professional if your pain does not go away, if it gets worse, or if you have new or a different type of pain. You may develop tolerance to the medicine. Tolerance means that you will need a higher dose of the medicine for pain relief. Tolerance is normal and is expected if you take this medicine for a long time. Do not suddenly stop taking your medicine because you may develop a severe reaction. Your body becomes used  to the medicine. This does NOT mean you are addicted. Addiction is a behavior related to getting and using a drug for a non-medical reason. If you have pain, you have a medical reason to take pain medicine. Your doctor will tell you how much medicine to take. If your doctor wants you to stop the medicine, the dose will be slowly lowered over time to avoid any side effects. You may get drowsy or dizzy. Do not drive, use machinery, or do anything that needs mental alertness until you know how this medicine affects you. Do not stand or sit up quickly, especially if you are an older patient. This reduces the risk of dizzy or fainting spells. Alcohol can increase or decrease the effects of this medicine. Avoid alcoholic drinks. You may have constipation. Try to have a bowel movement at least every 2 to 3 days. If you do not have a bowel movement for 3 days, call your doctor or health care professional. Your mouth may get dry. Chewing sugarless gum or sucking hard candy, and drinking plenty of water may help. Contact your  doctor if the problem does not go away or is severe. What side effects may I notice from receiving this medicine? Side effects that you should report to your doctor or health care professional as soon as possible: -allergic reactions like skin rash, itching or hives, swelling of the face, lips, or tongue -breathing difficulties, wheezing -confusion -itching -light headedness or fainting spells -redness, blistering, peeling or loosening of the skin, including inside the mouth -seizures Side effects that usually do not require medical attention (report to your doctor or health care professional if they continue or are bothersome): -constipation -dizziness -drowsiness -headache -nausea, vomiting This list may not describe all possible side effects. Call your doctor for medical advice about side effects. You may report side effects to FDA at 1-800-FDA-1088. Where should I keep my  medicine? Keep out of the reach of children. Store at room temperature between 15 and 30 degrees C (59 and 86 degrees F). Keep container tightly closed. Throw away any unused medicine after the expiration date. NOTE: This sheet is a summary. It may not cover all possible information. If you have questions about this medicine, talk to your doctor, pharmacist, or health care provider.  2013, Elsevier/Gold Standard. (01/31/2010 11:55:44 AM)  Seborrheic Keratosis Seborrheic keratosis is a common, noncancerous (benign) skin growth that can occur anywhere on the skin.It looks like "stuck-on," waxy, rough, tan, brown, or black spots on the skin. These skin growths can be flat or raised.They are often called "barnacles" because of their pasted-on appearance.Usually, these skin growths appear in adulthood, around age 27, and increase in number as you age. They may also develop during pregnancy or following estrogen therapy. Many people may only have one growth appear in their lifetime, while some people may develop many growths. CAUSES It is unknown what causes these skin growths, but they appear to run in families. SYMPTOMS Seborrheic keratosis is often located on the face, chest, shoulders, back, or other areas. These growths are:  Usually painless, but may become irritated and itchy.  Yellow, brown, black, or other colors.  Slightly raised or have a flat surface.  Sometimes rough or wart-like in texture.  Often waxy on the surface.  Round or oval-shaped.  Sometimes "stuck-on" in appearance.  Sometimes single, but there are usually many growths. Any growth that bleeds, itches on a regular basis, becomes inflamed, or becomes irritated needs to be evaluated by a skin specialist (dermatologist). DIAGNOSIS Diagnosis is mainly based on the way the growths appear. In some cases, it can be difficult to tell this type of skin growth from skin cancer. A skin growth tissue sample (biopsy) may be  used to confirm the diagnosis. TREATMENT Most often, treatment is not needed because the skin growths are benign.If the skin growth is irritated easily by clothing or jewelry, causing it to scab or bleed, treatment may be recommended. Patients may also choose to have the growths removed because they do not like their appearance. Most commonly, these growths are treated with cryosurgery. In cryosurgery, liquid nitrogen is applied to "freeze" the growth. The growth usually falls off within a matter of days. A blister may form and dry into a scab that will also fall off. After the growth or scab falls off, it may leave a dark or light spot on the skin. This color may fade over time, or it may remain permanent on the skin. HOME CARE INSTRUCTIONS If the skin growths are treated with cryosurgery, the treated area needs to be kept clean with  water and soap. SEEK MEDICAL CARE IF:  You have questions about these growths or other skin problems.  You develop new symptoms, including:  A change in the appearance of the skin growth.  New growths.  Any bleeding, itching, or pain in the growths.  A skin growth that looks similar to seborrheic keratosis. Document Released: 06/22/2010 Document Revised: 08/12/2011 Document Reviewed: 06/22/2010 [facility] Patient Information 2014 [place], [place].

## 2012-12-16 NOTE — Assessment & Plan Note (Addendum)
No falls since 07/2012 awaiting records from outside facility in [place] to see ED visit details.

## 2012-12-17 ENCOUNTER — Encounter: Payer: Self-pay | Admitting: Internal Medicine

## 2012-12-17 LAB — BASIC METABOLIC PANEL WITH GFR
CO2: 27 mEq/L (ref 19–32)
Calcium: 8.8 mg/dL (ref 8.4–10.5)
Creat: 0.94 mg/dL (ref 0.50–1.10)
Glucose, Bld: 89 mg/dL (ref 70–99)

## 2012-12-17 NOTE — Progress Notes (Signed)
Case discussed with Dr. McLean at the time of the visit.  We reviewed the resident's history and exam and pertinent patient test results.  I agree with the assessment, diagnosis, and plan of care documented in the resident's note.     

## 2013-01-06 ENCOUNTER — Other Ambulatory Visit: Payer: Self-pay

## 2013-02-12 ENCOUNTER — Emergency Department (HOSPITAL_BASED_OUTPATIENT_CLINIC_OR_DEPARTMENT_OTHER): Payer: Medicare Other

## 2013-02-12 ENCOUNTER — Emergency Department (HOSPITAL_BASED_OUTPATIENT_CLINIC_OR_DEPARTMENT_OTHER)
Admission: EM | Admit: 2013-02-12 | Discharge: 2013-02-12 | Disposition: A | Discharge status: Home / Self Care | Payer: Medicare Other | Attending: Emergency Medicine | Admitting: Emergency Medicine

## 2013-02-12 ENCOUNTER — Encounter (HOSPITAL_BASED_OUTPATIENT_CLINIC_OR_DEPARTMENT_OTHER): Payer: Self-pay | Admitting: *Deleted

## 2013-02-12 DIAGNOSIS — Z8541 Personal history of malignant neoplasm of cervix uteri: Secondary | ICD-10-CM | POA: Diagnosis not present

## 2013-02-12 DIAGNOSIS — Z79899 Other long term (current) drug therapy: Secondary | ICD-10-CM | POA: Insufficient documentation

## 2013-02-12 DIAGNOSIS — Z7982 Long term (current) use of aspirin: Secondary | ICD-10-CM | POA: Diagnosis not present

## 2013-02-12 DIAGNOSIS — S239XXA Sprain of unspecified parts of thorax, initial encounter: Secondary | ICD-10-CM | POA: Diagnosis not present

## 2013-02-12 DIAGNOSIS — Z8619 Personal history of other infectious and parasitic diseases: Secondary | ICD-10-CM | POA: Insufficient documentation

## 2013-02-12 DIAGNOSIS — S335XXA Sprain of ligaments of lumbar spine, initial encounter: Secondary | ICD-10-CM | POA: Insufficient documentation

## 2013-02-12 DIAGNOSIS — Z862 Personal history of diseases of the blood and blood-forming organs and certain disorders involving the immune mechanism: Secondary | ICD-10-CM | POA: Insufficient documentation

## 2013-02-12 DIAGNOSIS — X58XXXA Exposure to other specified factors, initial encounter: Secondary | ICD-10-CM | POA: Insufficient documentation

## 2013-02-12 DIAGNOSIS — Y939 Activity, unspecified: Secondary | ICD-10-CM | POA: Insufficient documentation

## 2013-02-12 DIAGNOSIS — J45909 Unspecified asthma, uncomplicated: Secondary | ICD-10-CM | POA: Insufficient documentation

## 2013-02-12 DIAGNOSIS — S39012A Strain of muscle, fascia and tendon of lower back, initial encounter: Secondary | ICD-10-CM

## 2013-02-12 DIAGNOSIS — Z8739 Personal history of other diseases of the musculoskeletal system and connective tissue: Secondary | ICD-10-CM | POA: Insufficient documentation

## 2013-02-12 DIAGNOSIS — N3289 Other specified disorders of bladder: Secondary | ICD-10-CM | POA: Diagnosis not present

## 2013-02-12 DIAGNOSIS — Y929 Unspecified place or not applicable: Secondary | ICD-10-CM | POA: Insufficient documentation

## 2013-02-12 LAB — COMPREHENSIVE METABOLIC PANEL
ALT: 9 U/L (ref 0–35)
Calcium: 9.5 mg/dL (ref 8.4–10.5)
GFR calc Af Amer: 88 mL/min — ABNORMAL LOW (ref >90)
Glucose, Bld: 96 mg/dL (ref 70–99)
Sodium: 140 mEq/L (ref 135–145)
Total Protein: 6.6 g/dL (ref 6.0–8.3)

## 2013-02-12 LAB — CBC WITH DIFFERENTIAL/PLATELET
Basophils Absolute: 0 10*3/uL (ref 0.0–0.1)
Eosinophils Absolute: 0.1 10*3/uL (ref 0.0–0.7)
Eosinophils Relative: 3 % (ref 0–5)
MCH: 33.3 pg (ref 26.0–34.0)
MCV: 99.7 fL (ref 78.0–100.0)
Platelets: 179 10*3/uL (ref 150–400)
RDW: 12.2 % (ref 11.5–15.5)

## 2013-02-12 LAB — URINALYSIS, ROUTINE W REFLEX MICROSCOPIC
Glucose, UA: NEGATIVE mg/dL
Hgb urine dipstick: NEGATIVE
Ketones, ur: NEGATIVE mg/dL
Protein, ur: NEGATIVE mg/dL

## 2013-02-12 LAB — URINE MICROSCOPIC-ADD ON

## 2013-02-12 MED ORDER — SODIUM CHLORIDE 0.9 % IV BOLUS (SEPSIS)
1000.0000 mL | INTRAVENOUS | Status: AC
  Administered 2013-02-12: 1000 mL via INTRAVENOUS

## 2013-02-12 MED ORDER — OXYCODONE-ACETAMINOPHEN 5-325 MG PO TABS: 1.0000 | ORAL_TABLET | Freq: Four times a day (QID) | ORAL | Status: DC | PRN

## 2013-02-12 MED ORDER — CEPHALEXIN 500 MG PO CAPS: 1000.0000 mg | ORAL_CAPSULE | Freq: Two times a day (BID) | ORAL | Status: DC

## 2013-02-12 MED ORDER — KETOROLAC TROMETHAMINE 30 MG/ML IJ SOLN
30.0000 mg | Freq: Once | INTRAMUSCULAR | Status: AC
  Administered 2013-02-12: 30 mg via INTRAVENOUS
  Filled 2013-02-12: qty 1

## 2013-02-12 NOTE — ED Notes (Signed)
Patient transported to CT 
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
## 2013-02-12 NOTE — ED Notes (Signed)
Patient states she has had urinary pressure for the last three days.  States last night she developed pain in her right back and noticed that her urine looked "thrashy".  History of frequent urinary tract infections.

## 2013-02-13 LAB — URINE CULTURE: Culture: NO GROWTH

## 2013-02-19 ENCOUNTER — Encounter: Payer: Self-pay | Admitting: Internal Medicine

## 2013-02-19 NOTE — Progress Notes (Signed)
Patient ID: Rachel Murray, female   DOB: 09/23/1945, 67 y.o.   MRN: [id number]  This patient has been identified as a high fall risk and is a female that is 65+.  Please consider ordering a DEXA scan to screen for osteoporosis.   Patient has had DEXA ordered (or may have been cancelled), consider discussing with patient, thanks!

## 2013-04-08 ENCOUNTER — Other Ambulatory Visit: Payer: Self-pay

## 2013-04-27 ENCOUNTER — Ambulatory Visit: Payer: Medicare Other | Admitting: Internal Medicine

## 2013-04-27 ENCOUNTER — Encounter: Payer: Self-pay | Admitting: Internal Medicine

## 2013-06-08 ENCOUNTER — Ambulatory Visit: Payer: Medicare Other | Admitting: Internal Medicine

## 2013-06-10 ENCOUNTER — Emergency Department (HOSPITAL_BASED_OUTPATIENT_CLINIC_OR_DEPARTMENT_OTHER)
Admission: EM | Admit: 2013-06-10 | Discharge: 2013-06-10 | Disposition: A | Discharge status: Home / Self Care | Payer: Medicare Other | Attending: Emergency Medicine | Admitting: Emergency Medicine

## 2013-06-10 ENCOUNTER — Encounter (HOSPITAL_BASED_OUTPATIENT_CLINIC_OR_DEPARTMENT_OTHER): Payer: Self-pay | Admitting: Emergency Medicine

## 2013-06-10 DIAGNOSIS — Z8639 Personal history of other endocrine, nutritional and metabolic disease: Secondary | ICD-10-CM | POA: Insufficient documentation

## 2013-06-10 DIAGNOSIS — IMO0001 Reserved for inherently not codable concepts without codable children: Secondary | ICD-10-CM | POA: Diagnosis not present

## 2013-06-10 DIAGNOSIS — Z79899 Other long term (current) drug therapy: Secondary | ICD-10-CM | POA: Diagnosis not present

## 2013-06-10 DIAGNOSIS — N390 Urinary tract infection, site not specified: Secondary | ICD-10-CM

## 2013-06-10 DIAGNOSIS — Z87828 Personal history of other (healed) physical injury and trauma: Secondary | ICD-10-CM | POA: Insufficient documentation

## 2013-06-10 DIAGNOSIS — Z8619 Personal history of other infectious and parasitic diseases: Secondary | ICD-10-CM | POA: Diagnosis not present

## 2013-06-10 DIAGNOSIS — Z7982 Long term (current) use of aspirin: Secondary | ICD-10-CM | POA: Insufficient documentation

## 2013-06-10 DIAGNOSIS — J45909 Unspecified asthma, uncomplicated: Secondary | ICD-10-CM | POA: Diagnosis not present

## 2013-06-10 DIAGNOSIS — Z862 Personal history of diseases of the blood and blood-forming organs and certain disorders involving the immune mechanism: Secondary | ICD-10-CM | POA: Diagnosis not present

## 2013-06-10 DIAGNOSIS — Z8541 Personal history of malignant neoplasm of cervix uteri: Secondary | ICD-10-CM | POA: Insufficient documentation

## 2013-06-10 LAB — URINALYSIS, ROUTINE W REFLEX MICROSCOPIC
Bilirubin Urine: NEGATIVE
Glucose, UA: NEGATIVE mg/dL
Ketones, ur: NEGATIVE mg/dL
NITRITE: POSITIVE — AB
PROTEIN: NEGATIVE mg/dL
Specific Gravity, Urine: 1.015 (ref 1.005–1.030)
UROBILINOGEN UA: 1 mg/dL (ref 0.0–1.0)
pH: 5.5 (ref 5.0–8.0)

## 2013-06-10 LAB — URINE MICROSCOPIC-ADD ON

## 2013-06-10 MED ORDER — CEPHALEXIN 500 MG PO CAPS: 500.0000 mg | ORAL_CAPSULE | Freq: Four times a day (QID) | ORAL | Status: DC
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
## 2013-06-10 NOTE — Discharge Instructions (Signed)
Return to the ED with any concerns including vomiting and not able to keep down liquids or antibiotics, abdominal pain, fever/chills, decreased level of alertness/lethargy, or any other alarming symptoms

## 2013-06-10 NOTE — ED Notes (Signed)
Pt c/o urinary symptoms x 1 week painful freq urination

## 2013-06-11 ENCOUNTER — Encounter: Payer: Medicare Other | Admitting: Internal Medicine

## 2013-06-12 LAB — URINE CULTURE: Colony Count: >=100000

## 2013-06-13 ENCOUNTER — Telehealth (HOSPITAL_COMMUNITY): Payer: Self-pay | Admitting: Emergency Medicine

## 2013-06-13 NOTE — ED Notes (Signed)
Post ED Visit - Positive Culture Follow-up  Culture report reviewed by antimicrobial stewardship pharmacist: [] Wes Dulaney, Pharm.D., BCPS [] Jeremy Frens, Pharm.D., BCPS [] Elizabeth Martin, Pharm.D., BCPS [] Minh Pham, Pharm.D., BCPS, AAHIVP [] Michelle Turner, Pharm.D., BCPS, AAHIVP [x] Andrew Meyer, Pharm.D., BCPS  Positive urine culture Treated with Keflex, organism sensitive to the same and no further patient follow-up is required at this time.  Rachel Murray 06/13/2013, 1:18 PM   

## 2013-06-15 ENCOUNTER — Ambulatory Visit (INDEPENDENT_AMBULATORY_CARE_PROVIDER_SITE_OTHER): Payer: Medicare Other | Admitting: Internal Medicine

## 2013-06-15 VITALS — BP 112/68 | HR 91 | Temp 97.0°F | Ht 66.5 in | Wt 135.1 lb

## 2013-06-15 DIAGNOSIS — Z0000 Encounter for general adult medical examination without abnormal findings: Secondary | ICD-10-CM

## 2013-06-15 DIAGNOSIS — D649 Anemia, unspecified: Secondary | ICD-10-CM

## 2013-06-15 DIAGNOSIS — E538 Deficiency of other specified B group vitamins: Secondary | ICD-10-CM | POA: Diagnosis not present

## 2013-06-15 DIAGNOSIS — N390 Urinary tract infection, site not specified: Secondary | ICD-10-CM | POA: Diagnosis not present

## 2013-06-15 DIAGNOSIS — E559 Vitamin D deficiency, unspecified: Secondary | ICD-10-CM

## 2013-06-15 DIAGNOSIS — M791 Myalgia, unspecified site: Secondary | ICD-10-CM

## 2013-06-15 DIAGNOSIS — J45991 Cough variant asthma: Secondary | ICD-10-CM

## 2013-06-15 DIAGNOSIS — M5431 Sciatica, right side: Secondary | ICD-10-CM

## 2013-06-15 LAB — CBC WITH DIFFERENTIAL/PLATELET
BASOS ABS: 0 10*3/uL (ref 0.0–0.1)
Basophils Relative: 0 % (ref 0–1)
EOS ABS: 0.2 10*3/uL (ref 0.0–0.7)
EOS PCT: 4 % (ref 0–5)
HCT: 32.1 % — ABNORMAL LOW (ref 36.0–46.0)
Hemoglobin: 10.7 g/dL — ABNORMAL LOW (ref 12.0–15.0)
LYMPHS PCT: 32 % (ref 12–46)
Lymphs Abs: 1.1 10*3/uL (ref 0.7–4.0)
MCH: 32.8 pg (ref 26.0–34.0)
MCHC: 33.3 g/dL (ref 30.0–36.0)
MCV: 98.5 fL (ref 78.0–100.0)
Monocytes Absolute: 0.5 10*3/uL (ref 0.1–1.0)
Monocytes Relative: 13 % — ABNORMAL HIGH (ref 3–12)
Neutro Abs: 1.8 10*3/uL (ref 1.7–7.7)
Neutrophils Relative %: 51 % (ref 43–77)
PLATELETS: 210 10*3/uL (ref 150–400)
RBC: 3.26 MIL/uL — ABNORMAL LOW (ref 3.87–5.11)
RDW: 14 % (ref 11.5–15.5)
WBC: 3.6 10*3/uL — AB (ref 4.0–10.5)

## 2013-06-15 MED ORDER — ALBUTEROL SULFATE HFA 108 (90 BASE) MCG/ACT IN AERS: 2.0000 | INHALATION_SPRAY | Freq: Four times a day (QID) | RESPIRATORY_TRACT | Status: DC | PRN

## 2013-06-15 MED ORDER — GABAPENTIN 300 MG PO CAPS: 300.0000 mg | ORAL_CAPSULE | Freq: Three times a day (TID) | ORAL | Status: DC

## 2013-06-15 MED ORDER — CYANOCOBALAMIN 1000 MCG/ML IJ SOLN
1000.0000 ug | Freq: Once | INTRAMUSCULAR | Status: AC
  Administered 2013-06-15: 1000 ug via INTRAMUSCULAR

## 2013-06-15 MED ORDER — TRAMADOL HCL 50 MG PO TABS: 50.0000 mg | ORAL_TABLET | Freq: Three times a day (TID) | ORAL | Status: DC | PRN

## 2013-06-15 MED ORDER — CYCLOBENZAPRINE HCL 10 MG PO TABS: 10.0000 mg | ORAL_TABLET | Freq: Every day | ORAL | Status: DC

## 2013-06-15 MED ORDER — ASPIRIN 81 MG PO TABS: 81.0000 mg | ORAL_TABLET | Freq: Every day | ORAL | Status: AC

## 2013-06-15 NOTE — Progress Notes (Signed)
Case discussed with Dr. Wilson soon after the resident saw the patient. We reviewed the resident's history and exam and pertinent patient test results. I agree with the assessment, diagnosis, and plan of care documented in the resident's note. 

## 2013-06-15 NOTE — Assessment & Plan Note (Signed)
Referred for mammography and colonoscopy on 06/15/13.

## 2013-06-15 NOTE — Assessment & Plan Note (Addendum)
She was seen at [facility] ED 5 days ago for UTI symptoms.  Nothing to suggest pyelonephritis as she is afebrile, no N/V, no current flank pain or CVAT.  She is taking Keflex qid as prescribed at ED visit.  She denies UTI symptoms currently and agrees to finish all of the antibiotic.  She was instructed to call clinic if symptoms return.

## 2013-06-15 NOTE — Assessment & Plan Note (Signed)
Patient stopped taking pills a few months ago.  She says she has more energy with the shot.  B12 shot provided today.

## 2013-06-15 NOTE — Progress Notes (Signed)
   Subjective:    Patient ID: Rachel Murray, female    DOB: December 25, 1945, 68 y.o.   MRN: [id number]  HPI Comments: Ms. Mossa is a 68 year old with a PMH of asthma, HBV, vitamin B12 and vitamin D deficiency who presents for urgent care follow-up.  She was out of town two weeks ago and had 1 night of N/V and diarrhea.  She then developed B/L flank pain, dysuria and foul smelling urine.  She denies fevers/chills.  She went to [place] on 01/09 and was prescribed Keflex qid #28 pills.  She has been compliant with her medications.  Her symptoms have resolved.   She would like blood work today to check her B12.  She used to take the vitamin B12 shot and was switched to the pills last year.  She says she feels she has more energy with the B12 shots.  She stopped taking the pills a few months ago.       Review of Systems  Constitutional: Negative for fever, chills and appetite change.  Respiratory: Negative for shortness of breath.   Cardiovascular: Negative for chest pain.  Gastrointestinal: Negative for nausea, vomiting and diarrhea.  Genitourinary: Negative for dysuria.  Neurological: Negative for dizziness, light-headedness and headaches.       Objective:   Physical Exam  Vitals reviewed. Constitutional: She is oriented to person, place, and time. No distress.  HENT:  Mouth/Throat: Oropharynx is clear and moist. No oropharyngeal exudate.  Eyes: EOM are normal.  Neck: Neck supple.  Cardiovascular: Normal rate, regular rhythm and normal heart sounds.   Pulmonary/Chest: Effort normal and breath sounds normal. No respiratory distress. She has no wheezes. She has no rales.  Abdominal: Soft. Bowel sounds are normal. She exhibits no distension. There is no tenderness.  No CVAT.  Neurological: She is alert and oriented to person, place, and time.  Skin: Skin is warm. She is not diaphoretic.  Psychiatric: She has a normal mood and affect. Her behavior is normal.            Assessment & Plan:  Please see problem based assessment and plan.

## 2013-06-15 NOTE — Patient Instructions (Addendum)
1. Please continue taking antibiotic for your urinary tract infection.  Call clinic if your symptoms return after you have finished the antibiotic.  2. You will be contacted regarding dates for your mammogram and colonoscopy.  3. Please take all medications as prescribed.   4. If you have worsening of your symptoms or new symptoms arise, please call the clinic ([phone]), or go to the ER immediately if symptoms are severe.  5. Return to clinic in 2-3 months for follow-up.

## 2013-06-15 NOTE — Assessment & Plan Note (Signed)
She is not currently taking supplement.  Will re-check today and treat accordingly.

## 2013-06-16 ENCOUNTER — Ambulatory Visit (HOSPITAL_COMMUNITY)
Admission: RE | Admit: 2013-06-16 | Discharge: 2013-06-16 | Disposition: A | Discharge status: Home / Self Care | Payer: Medicare Other | Source: Ambulatory Visit | Attending: Internal Medicine | Admitting: Internal Medicine

## 2013-06-16 DIAGNOSIS — Z1231 Encounter for screening mammogram for malignant neoplasm of breast: Secondary | ICD-10-CM | POA: Insufficient documentation

## 2013-06-16 DIAGNOSIS — Z0000 Encounter for general adult medical examination without abnormal findings: Secondary | ICD-10-CM

## 2013-06-16 LAB — VITAMIN B12: Vitamin B-12: 365 pg/mL (ref 211–911)

## 2013-06-16 LAB — VITAMIN D 25 HYDROXY (VIT D DEFICIENCY, FRACTURES): Vit D, 25-Hydroxy: 12 ng/mL — ABNORMAL LOW (ref 30–89)

## 2013-06-22 ENCOUNTER — Other Ambulatory Visit: Payer: Self-pay | Admitting: Internal Medicine

## 2013-06-22 ENCOUNTER — Telehealth: Payer: Self-pay | Admitting: Internal Medicine

## 2013-06-22 DIAGNOSIS — E559 Vitamin D deficiency, unspecified: Secondary | ICD-10-CM

## 2013-06-22 MED ORDER — VITAMIN D (ERGOCALCIFEROL) 1.25 MG (50000 UNIT) PO CAPS: 50000.0000 [IU] | ORAL_CAPSULE | ORAL | Status: DC

## 2013-06-22 NOTE — Telephone Encounter (Signed)
I called and left a message with Ms. Render daughter asking that she call the clinic.  I was calling to inform her that I have sent a prescription for weekly vitamin d to her pharmacy.  She will need to return to clinic for vitamin d check in 6-8 weeks.

## 2013-06-30 ENCOUNTER — Encounter: Payer: Self-pay | Admitting: Internal Medicine

## 2013-07-09 ENCOUNTER — Other Ambulatory Visit: Payer: Self-pay | Admitting: *Deleted

## 2013-07-13 NOTE — Telephone Encounter (Signed)
Was this Feb. 6 refill done?

## 2013-07-15 NOTE — Telephone Encounter (Signed)
Refill sent 6th but still in inbasket. I looked into the refill. Does have tramadol contract but FYI after the contract states violation. I ran [place] and got 90 Tramadol in Aug through Dec each month from [facility] and that was reporting error. Only [facility] has been filling. But Dr Redmond Pulling Rx tramadol on Feb 6th and pt picked up at [facility]. Denied.

## 2013-08-06 NOTE — Addendum Note (Signed)
Addended by: Hulan Fray on: 08/06/2013 09:31 AM   Modules accepted: Orders

## 2013-08-10 ENCOUNTER — Other Ambulatory Visit: Payer: Self-pay | Admitting: *Deleted

## 2013-08-12 ENCOUNTER — Encounter: Payer: Medicare Other | Admitting: Internal Medicine

## 2013-08-13 ENCOUNTER — Encounter: Payer: Medicare Other | Admitting: Internal Medicine

## 2013-08-13 ENCOUNTER — Encounter: Payer: Self-pay | Admitting: Internal Medicine

## 2013-08-13 ENCOUNTER — Ambulatory Visit (INDEPENDENT_AMBULATORY_CARE_PROVIDER_SITE_OTHER): Payer: Medicare Other | Admitting: Internal Medicine

## 2013-08-13 VITALS — BP 136/77 | HR 84 | Temp 97.1°F | Wt 136.7 lb

## 2013-08-13 DIAGNOSIS — E559 Vitamin D deficiency, unspecified: Secondary | ICD-10-CM | POA: Diagnosis not present

## 2013-08-13 DIAGNOSIS — Z0000 Encounter for general adult medical examination without abnormal findings: Secondary | ICD-10-CM | POA: Insufficient documentation

## 2013-08-13 DIAGNOSIS — M5430 Sciatica, unspecified side: Secondary | ICD-10-CM | POA: Diagnosis not present

## 2013-08-13 DIAGNOSIS — IMO0002 Reserved for concepts with insufficient information to code with codable children: Secondary | ICD-10-CM | POA: Diagnosis not present

## 2013-08-13 DIAGNOSIS — N390 Urinary tract infection, site not specified: Secondary | ICD-10-CM | POA: Diagnosis not present

## 2013-08-13 DIAGNOSIS — E538 Deficiency of other specified B group vitamins: Secondary | ICD-10-CM | POA: Diagnosis not present

## 2013-08-13 MED ORDER — CYCLOBENZAPRINE HCL 10 MG PO TABS: 10.0000 mg | ORAL_TABLET | Freq: Every day | ORAL | Status: DC

## 2013-08-13 MED ORDER — TRAMADOL HCL 50 MG PO TABS: 50.0000 mg | ORAL_TABLET | Freq: Three times a day (TID) | ORAL | Status: DC | PRN

## 2013-08-13 MED ORDER — GABAPENTIN 300 MG PO CAPS: 300.0000 mg | ORAL_CAPSULE | Freq: Three times a day (TID) | ORAL | Status: DC

## 2013-08-13 MED ORDER — ZOSTER VACCINE LIVE 19400 UNT/0.65ML ~~LOC~~ SOLR: 0.6500 mL | Freq: Once | SUBCUTANEOUS | Status: DC

## 2013-08-13 NOTE — Telephone Encounter (Signed)
Called to pharm 

## 2013-08-13 NOTE — Assessment & Plan Note (Addendum)
Refill of tramadol given that Dr. Redmond Pulling already wrote for pt. Pt also requesting flexeril refill and gabapentin.

## 2013-08-13 NOTE — Assessment & Plan Note (Signed)
Requesting gabapentin refill

## 2013-08-13 NOTE — Progress Notes (Signed)
   Subjective:    Patient ID: Rachel Murray, female    DOB: [birth year], 68 y.o.   MRN: [id number]  HPI  68 yo with hx significant for degenerative disc disease, sciatica and self-reported "fibromyalgia".States that she has to take Tramadol three times a day for her lower back pain and presents for a refill for her "usual 90 pills".  Requesting refills for her gabapentin and flexeril for her sciatica, as well.  Reports a lot of stress from her teenage granddaughters.  States her oldeset son Dellis Filbert takes "good care" of her.   Review of Systems  Constitutional: Negative.   HENT: Negative.   Eyes: Negative.   Respiratory: Negative.   Cardiovascular: Negative.   Gastrointestinal: Negative.   Endocrine: Negative.   Genitourinary: Negative.   Musculoskeletal: Positive for arthralgias, back pain and myalgias.  Skin: Negative.   Allergic/Immunologic: Negative.   Neurological:       Burning feeling to bilateral hands  Hematological: Does not bruise/bleed easily.  Psychiatric/Behavioral:       Feeling stressed       Objective:   Physical Exam  Constitutional: She is oriented to person, place, and time. She appears well-developed and well-nourished. No distress.  HENT:  Head: Normocephalic and atraumatic.  Eyes: Conjunctivae and EOM are normal. Pupils are equal, round, and reactive to light.  Neck: Normal range of motion.  Cardiovascular: Normal rate.   Pulmonary/Chest: Effort normal.  Musculoskeletal: She exhibits no edema.  Neurological: She is alert and oriented to person, place, and time.  Skin: Skin is warm and dry.  Psychiatric:  Tearful when discussing her grandchildren "stressing" her out          Assessment & Plan:  See separate problem-list charting:

## 2013-08-13 NOTE — Patient Instructions (Addendum)
We have refilled your tramadol, gabapentin, and flexeril. Follow-up with your Primary Care Doctor in 6 months or earlier if needed.  Please bring your medicines with you each time you come.   Medicines may be:  Eye drops  Herbal   Vitamins  Pills  Seeing these help [place] take care of you.

## 2013-09-08 ENCOUNTER — Other Ambulatory Visit: Payer: Self-pay | Admitting: Internal Medicine

## 2013-09-08 DIAGNOSIS — Z13820 Encounter for screening for osteoporosis: Secondary | ICD-10-CM

## 2013-09-13 ENCOUNTER — Other Ambulatory Visit: Payer: Self-pay | Admitting: *Deleted

## 2013-09-14 ENCOUNTER — Telehealth: Payer: Self-pay | Admitting: *Deleted

## 2013-09-14 MED ORDER — TRAMADOL HCL 50 MG PO TABS: 50.0000 mg | ORAL_TABLET | Freq: Three times a day (TID) | ORAL | Status: DC | PRN

## 2013-09-14 NOTE — Telephone Encounter (Signed)
Call to pt message left on answering machine to call the Clinics .  Need to schedule patient for a Dexa Scan.  Sander Nephew, RN 09/14/2013.  11:31 AM.

## 2013-09-14 NOTE — Telephone Encounter (Signed)
Rx called in 

## 2013-10-18 ENCOUNTER — Other Ambulatory Visit: Payer: Self-pay | Admitting: *Deleted

## 2013-10-18 ENCOUNTER — Other Ambulatory Visit: Payer: Self-pay | Admitting: Internal Medicine

## 2013-10-18 NOTE — Telephone Encounter (Signed)
closed

## 2013-10-21 NOTE — Telephone Encounter (Signed)
Rx called in 

## 2013-11-08 ENCOUNTER — Telehealth: Payer: Self-pay | Admitting: *Deleted

## 2013-11-08 NOTE — Telephone Encounter (Signed)
Call to pt to inform her of an appointment for a Dexa Scan.  Pt is scheduled for 11/18/2013 At 1:00 PM at [place] was unavailable- message left to call the Clinics and to ask for Devora Tortorella on Home # unable to leave message on Cell.  Sander Nephew, RN 11/08/2013 3:14 PM.

## 2013-11-17 ENCOUNTER — Other Ambulatory Visit: Payer: Self-pay | Admitting: *Deleted

## 2013-11-17 DIAGNOSIS — IMO0002 Reserved for concepts with insufficient information to code with codable children: Secondary | ICD-10-CM

## 2013-11-17 DIAGNOSIS — M5430 Sciatica, unspecified side: Secondary | ICD-10-CM

## 2013-11-18 ENCOUNTER — Ambulatory Visit (HOSPITAL_COMMUNITY): Payer: Medicare Other | Attending: Internal Medicine

## 2013-11-18 MED ORDER — TRAMADOL HCL 50 MG PO TABS: ORAL_TABLET | ORAL | Status: DC

## 2013-11-18 MED ORDER — GABAPENTIN 300 MG PO CAPS: 300.0000 mg | ORAL_CAPSULE | Freq: Three times a day (TID) | ORAL | Status: DC

## 2013-11-18 NOTE — Telephone Encounter (Signed)
Tramadol rx called to [facility]. 

## 2013-12-15 ENCOUNTER — Other Ambulatory Visit: Payer: Self-pay | Admitting: *Deleted

## 2013-12-15 ENCOUNTER — Encounter: Payer: Medicare Other | Admitting: Internal Medicine

## 2013-12-17 MED ORDER — TRAMADOL HCL 50 MG PO TABS: ORAL_TABLET | ORAL | Status: DC

## 2013-12-17 NOTE — Telephone Encounter (Signed)
Dr Redmond Pulling did discuss with me. Dr Redmond Pulling has only been able to see pt once in Jan. She has appt late July and will need to keep that appt. Dr Redmond Pulling will review need / goals of opioid therapy and will establish expectations with the pt. If inappropriate behavior continues, we will need to discuss whether the [facility] is the appropriate location for pt to get healthcare.

## 2013-12-17 NOTE — Telephone Encounter (Signed)
Tramadol was called to [facility] [place], to pharmacist Clair Gulling with instructions of do not fill until 12/18/2013 or after. Pt notified that she may pick med up tomorrow and that for future refills she will need to keep her upcoming appt, she said she would keep it and was pleasant during this call

## 2013-12-17 NOTE — Telephone Encounter (Addendum)
Pt calls and is very angry about not having her tramadol filled yet, it was filled last month on 6/18 per [facility] [place], in the prescription it states that it was not to be filled until 6/20, pharmacy states when it was called in they were not told not to fill until 6/20 so they filled it 6/18. Dr Redmond Pulling states it was not to be filled until 6/20 but it is just now 6/17 and pt has no showed a 6/18 appt and cancelled a 7/15 appt. Pt states she will call the "director of the hospital if her script is not filled in 2 hours, she also states "i dont have to put up with this bullshit". i pulled dr Redmond Pulling out of a visit with a clinic pt and spoke to her concerning the issue, she states that she will speak to dr Software engineer and pt will be informed of decision by end of day. Copy to dr Software engineer, dr Redmond Pulling and jim shaw.

## 2013-12-22 ENCOUNTER — Encounter: Payer: Self-pay | Admitting: Internal Medicine

## 2013-12-22 ENCOUNTER — Ambulatory Visit (INDEPENDENT_AMBULATORY_CARE_PROVIDER_SITE_OTHER): Payer: Medicare Other | Admitting: Internal Medicine

## 2013-12-22 VITALS — BP 121/79 | HR 81 | Temp 97.6°F | Wt 136.6 lb

## 2013-12-22 DIAGNOSIS — E538 Deficiency of other specified B group vitamins: Secondary | ICD-10-CM | POA: Diagnosis not present

## 2013-12-22 DIAGNOSIS — Z0000 Encounter for general adult medical examination without abnormal findings: Secondary | ICD-10-CM

## 2013-12-22 DIAGNOSIS — M51379 Other intervertebral disc degeneration, lumbosacral region without mention of lumbar back pain or lower extremity pain: Secondary | ICD-10-CM | POA: Diagnosis not present

## 2013-12-22 DIAGNOSIS — J45991 Cough variant asthma: Secondary | ICD-10-CM | POA: Diagnosis not present

## 2013-12-22 DIAGNOSIS — M5137 Other intervertebral disc degeneration, lumbosacral region: Secondary | ICD-10-CM

## 2013-12-22 DIAGNOSIS — M5136 Other intervertebral disc degeneration, lumbar region: Secondary | ICD-10-CM

## 2013-12-22 DIAGNOSIS — E559 Vitamin D deficiency, unspecified: Secondary | ICD-10-CM | POA: Diagnosis not present

## 2013-12-22 DIAGNOSIS — M5430 Sciatica, unspecified side: Secondary | ICD-10-CM | POA: Diagnosis not present

## 2013-12-22 DIAGNOSIS — IMO0002 Reserved for concepts with insufficient information to code with codable children: Secondary | ICD-10-CM | POA: Diagnosis not present

## 2013-12-22 DIAGNOSIS — N390 Urinary tract infection, site not specified: Secondary | ICD-10-CM | POA: Diagnosis not present

## 2013-12-22 LAB — COMPLETE METABOLIC PANEL WITH GFR
ALK PHOS: 80 U/L (ref 39–117)
ALT: 9 U/L (ref 0–35)
AST: 16 U/L (ref 0–37)
Albumin: 4.2 g/dL (ref 3.5–5.2)
BILIRUBIN TOTAL: 0.7 mg/dL (ref 0.2–1.2)
BUN: 11 mg/dL (ref 6–23)
CO2: 28 mEq/L (ref 19–32)
Calcium: 9.3 mg/dL (ref 8.4–10.5)
Chloride: 103 mEq/L (ref 96–112)
Creat: 0.82 mg/dL (ref 0.50–1.10)
GFR, EST NON AFRICAN AMERICAN: 75 mL/min
GFR, Est African American: 86 mL/min
GLUCOSE: 143 mg/dL — AB (ref 70–99)
Potassium: 4.7 mEq/L (ref 3.5–5.3)
Sodium: 139 mEq/L (ref 135–145)
Total Protein: 7 g/dL (ref 6.0–8.3)

## 2013-12-22 NOTE — Progress Notes (Signed)
   Subjective:    Patient ID: Rachel Murray, female    DOB: Mar 23, 1946, 68 y.o.   MRN: [id number]  HPI Comments: Rachel Murray is a 68 year old with a PMH of asthma, vitamin B12 and DDD who presents for follow-up of chronic medical conditions.     Takes Tramadol and gabapentin for chronic back pain.   When she is out of Tramadol her back pain increases.  She takes Tramadol on average three times daily.  Feels the tramadol helps her get out and do her yard work and helps her stay busy.  She takes lots of tylenol when she runs out of Tramadol.  Last took Tramadol this morning.       Review of Systems  Constitutional: Negative for fever, chills and appetite change.  Respiratory: Negative for shortness of breath.   Cardiovascular: Negative for chest pain, palpitations and leg swelling.  Gastrointestinal: Negative for nausea, vomiting, diarrhea, constipation and blood in stool.  Genitourinary: Negative for dysuria and frequency.  Musculoskeletal: Positive for back pain.  Neurological: Negative for syncope, weakness, light-headedness and headaches.       Objective:   Physical Exam  Vitals reviewed. Constitutional: She is oriented to person, place, and time. She appears well-developed. No distress.  HENT:  Head: Normocephalic and atraumatic.  Mouth/Throat: Oropharynx is clear and moist. No oropharyngeal exudate.  Eyes: EOM are normal. Pupils are equal, round, and reactive to light.  Cardiovascular: Normal rate, regular rhythm and normal heart sounds.  Exam reveals no gallop and no friction rub.   No murmur heard. Pulmonary/Chest: Effort normal and breath sounds normal. No respiratory distress. She has no wheezes. She has no rales.  Abdominal: Soft. Bowel sounds are normal. She exhibits no distension. There is no tenderness.  Musculoskeletal: Normal range of motion. She exhibits no edema and no tenderness.  Neurological: She is alert and oriented to person, place, and time. No cranial nerve  deficit.  Skin: Skin is warm. She is not diaphoretic.  Psychiatric: She has a normal mood and affect. Her behavior is normal.          Assessment & Plan:  Please see problem based assessment and plan.

## 2013-12-22 NOTE — Patient Instructions (Addendum)
1. I will call you if there are lab abnormalities.   2. Please take all medications as prescribed.    3. If you have worsening of your symptoms or new symptoms arise, please call the clinic ([phone]), or go to the ER immediately if symptoms are severe.  Please return to clinic next month to see how your symptoms are doing with less Tramadol.

## 2013-12-23 LAB — PRESCRIPTION ABUSE MONITORING 15P, URINE
AMPHETAMINE/METH: NEGATIVE ng/mL
BENZODIAZEPINE SCREEN, URINE: NEGATIVE ng/mL
Barbiturate Screen, Urine: NEGATIVE ng/mL
Buprenorphine, Urine: NEGATIVE ng/mL
CANNABINOID SCRN UR: NEGATIVE ng/mL
CARISOPRODOL, URINE: NEGATIVE ng/mL
Cocaine Metabolites: NEGATIVE ng/mL
Creatinine, Urine: 65.15 mg/dL (ref >20.0)
FENTANYL URINE: NEGATIVE ng/mL
Meperidine, Ur: NEGATIVE ng/mL
Methadone Screen, Urine: NEGATIVE ng/mL
Opiate Screen, Urine: NEGATIVE ng/mL
Oxycodone Screen, Ur: NEGATIVE ng/mL
Propoxyphene: NEGATIVE ng/mL
ZOLPIDEM, URINE: NEGATIVE ng/mL

## 2013-12-23 NOTE — Assessment & Plan Note (Addendum)
Please see notes under degenerative disc disease.

## 2013-12-23 NOTE — Assessment & Plan Note (Addendum)
She feels the Tramadol and gabapentin help her to do activities that she would otherwise be in too much pain to do.  She is willing to try decreasing Tramadol to q12h.  I asked her to provide a urine for tox screen because I did not have one on file and she agreed to this.  I also asked her to sign a new pain contract because I was re-evaluating her today and we were making changes.  I also was unable to locate the original pain contract in EPIC so I need a new contract signed.  She was initially agreeable to signing the contract but then became irritated once she read the contract.  She was concerned that she would have to get her Tramadol from our clinic only.  She said that would be a problem if she was out of town and needed a refill.  I tried to address her concerns but she refused to sign the contract until she reviewed it with her children.  I said that would be fine but that I could not provide additional Tramadol refills until she returned to sign the contract.  She will be due for refill in one month so I encouraged her to come back to see me before that so we review the contract. - UDS done - still need pain contract signed before writing refills - she is willing to try q12 Tramadol but will let me know if her pain is not well controlled - she will continue gabapentin

## 2013-12-23 NOTE — Assessment & Plan Note (Signed)
Normal B12 in January 2015.

## 2013-12-23 NOTE — Assessment & Plan Note (Signed)
She is willing to get colonoscopy.  Her daughter is having surgery this week and will be recovering for a few weeks.  She is agreeable to going in a month or two.

## 2013-12-23 NOTE — Assessment & Plan Note (Signed)
Stable. Rare inhaler use

## 2013-12-25 LAB — TRAMADOL, URINE
N-DESMETHYL-CIS-TRAMADOL: 6973 ng/mL — ABNORMAL HIGH (ref <100)
Tramadol, Urine: 26267 ng/mL — ABNORMAL HIGH (ref <100)

## 2013-12-27 NOTE — Progress Notes (Signed)
Case discussed with Dr. Wilson at the time of the visit.  We reviewed the resident's history and exam and pertinent patient test results.  I agree with the assessment, diagnosis, and plan of care documented in the resident's note. 

## 2014-01-12 ENCOUNTER — Encounter: Payer: Self-pay | Admitting: *Deleted

## 2014-01-17 ENCOUNTER — Other Ambulatory Visit: Payer: Self-pay | Admitting: Internal Medicine

## 2014-01-18 ENCOUNTER — Ambulatory Visit: Payer: Medicare Other | Admitting: Internal Medicine

## 2014-01-19 ENCOUNTER — Encounter: Payer: Medicare Other | Admitting: Internal Medicine

## 2014-01-21 ENCOUNTER — Ambulatory Visit (INDEPENDENT_AMBULATORY_CARE_PROVIDER_SITE_OTHER): Payer: Medicare Other | Admitting: Internal Medicine

## 2014-01-21 ENCOUNTER — Ambulatory Visit: Payer: Medicare Other | Admitting: Internal Medicine

## 2014-01-21 ENCOUNTER — Encounter: Payer: Self-pay | Admitting: Internal Medicine

## 2014-01-21 VITALS — BP 124/65 | HR 75 | Temp 98.2°F | Ht 67.0 in | Wt 137.6 lb

## 2014-01-21 DIAGNOSIS — N3001 Acute cystitis with hematuria: Secondary | ICD-10-CM

## 2014-01-21 DIAGNOSIS — M5137 Other intervertebral disc degeneration, lumbosacral region: Secondary | ICD-10-CM | POA: Diagnosis not present

## 2014-01-21 DIAGNOSIS — IMO0002 Reserved for concepts with insufficient information to code with codable children: Secondary | ICD-10-CM

## 2014-01-21 DIAGNOSIS — N390 Urinary tract infection, site not specified: Secondary | ICD-10-CM | POA: Diagnosis not present

## 2014-01-21 DIAGNOSIS — S39012D Strain of muscle, fascia and tendon of lower back, subsequent encounter: Secondary | ICD-10-CM

## 2014-01-21 DIAGNOSIS — X500XXA Overexertion from strenuous movement or load, initial encounter: Secondary | ICD-10-CM | POA: Diagnosis not present

## 2014-01-21 DIAGNOSIS — Z0000 Encounter for general adult medical examination without abnormal findings: Secondary | ICD-10-CM

## 2014-01-21 DIAGNOSIS — M5430 Sciatica, unspecified side: Secondary | ICD-10-CM | POA: Diagnosis not present

## 2014-01-21 DIAGNOSIS — R319 Hematuria, unspecified: Secondary | ICD-10-CM

## 2014-01-21 DIAGNOSIS — M51379 Other intervertebral disc degeneration, lumbosacral region without mention of lumbar back pain or lower extremity pain: Secondary | ICD-10-CM | POA: Diagnosis not present

## 2014-01-21 DIAGNOSIS — J45991 Cough variant asthma: Secondary | ICD-10-CM | POA: Diagnosis not present

## 2014-01-21 DIAGNOSIS — E538 Deficiency of other specified B group vitamins: Secondary | ICD-10-CM | POA: Diagnosis not present

## 2014-01-21 DIAGNOSIS — E559 Vitamin D deficiency, unspecified: Secondary | ICD-10-CM | POA: Diagnosis not present

## 2014-01-21 DIAGNOSIS — N3000 Acute cystitis without hematuria: Secondary | ICD-10-CM | POA: Diagnosis not present

## 2014-01-21 DIAGNOSIS — Z23 Encounter for immunization: Secondary | ICD-10-CM

## 2014-01-21 LAB — POCT URINALYSIS DIPSTICK
BILIRUBIN UA: NEGATIVE
Glucose, UA: NEGATIVE
KETONES UA: NEGATIVE
PH UA: 5
Protein, UA: NEGATIVE
Spec Grav, UA: 1.02
Urobilinogen, UA: 0.2

## 2014-01-21 MED ORDER — CYCLOBENZAPRINE HCL 5 MG PO TABS: 5.0000 mg | ORAL_TABLET | Freq: Every day | ORAL | Status: DC
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
## 2014-01-21 NOTE — Patient Instructions (Signed)
Thank you for your visit today.   Please return to the internal medicine clinic to see your PCP as needed or if your back pain does not improve in 6 weeks.   Please get your DEXA scan. We will give you your pneumovax today.  I have given you a short supply of flexeril--please take this at bedtime only as it may make you drowsy.  Please may take ibuprofen as directed on the bottle for 3-4 days. Please use a heating pad on a low setting as this may help your pain.    Your current medical regimen is effective;  continue present plan and take all medications as prescribed.    Please be sure to bring all of your medications with you to every visit; this includes herbal supplements, vitamins, eye drops, and any over-the-counter medications.   Should you have any questions regarding your medications and/or any new or worsening symptoms, please be sure to call the clinic at [phone].   If you believe that you are suffering from a life threatening condition or one that may result in the loss of limb or function, then you should call 911 or proceed to the nearest Emergency Department.     A healthy lifestyle and preventative care can promote health and wellness.   Maintain regular health, dental, and eye exams.  Eat a healthy diet. Foods like vegetables, fruits, whole grains, low-fat dairy products, and lean protein foods contain the nutrients you need without too many calories. Decrease your intake of foods high in solid fats, added sugars, and salt. Get information about a proper diet from your caregiver, if necessary.  Regular physical exercise is one of the most important things you can do for your health. Most adults should get at least 150 minutes of moderate-intensity exercise (any activity that increases your heart rate and causes you to sweat) each week. In addition, most adults need muscle-strengthening exercises on 2 or more days a week.   Maintain a healthy weight. The body mass  index (BMI) is a screening tool to identify possible weight problems. It provides an estimate of body fat based on height and weight. Your caregiver can help determine your BMI, and can help you achieve or maintain a healthy weight. For adults 20 years and older:  A BMI below 18.5 is considered underweight.  A BMI of 18.5 to 24.9 is normal.  A BMI of 25 to 29.9 is considered overweight.  A BMI of 30 and above is considered obese.

## 2014-01-22 LAB — URINALYSIS, MICROSCOPIC ONLY
Casts: NONE SEEN
Crystals: NONE SEEN
Squamous Epithelial / LPF: NONE SEEN

## 2014-01-22 LAB — URINALYSIS, ROUTINE W REFLEX MICROSCOPIC
Bilirubin Urine: NEGATIVE
Glucose, UA: NEGATIVE mg/dL
KETONES UR: NEGATIVE mg/dL
Nitrite: NEGATIVE
PH: 5.5 (ref 5.0–8.0)
PROTEIN: NEGATIVE mg/dL
SPECIFIC GRAVITY, URINE: 1.02 (ref 1.005–1.030)
UROBILINOGEN UA: 0.2 mg/dL (ref 0.0–1.0)

## 2014-01-23 DIAGNOSIS — R6889 Other general symptoms and signs: Secondary | ICD-10-CM | POA: Diagnosis not present

## 2014-01-23 NOTE — Assessment & Plan Note (Signed)
-  gave pneumovax today

## 2014-01-23 NOTE — Assessment & Plan Note (Addendum)
Pt reports back pain that began approximately 2 weeks ago.  She reports lifting some furniture around.  Also reports having a h/o lower back pain which has been much better recently.  She reports gabapentin, tramadol, and ibuprofen helps.  Also reports using a heating pad.  Denies any fever/chills, CP, SOB, N/V/C, abdominal pain, dysuria or other urinary symptoms.  No h/o malignancy except for cervical cancer that was treated ~30 years ago.  No h/o osteopenia/osteoporosis although it appear that pt has never had a DEXA although it has been ordered several times.  She reports taking Vit D supplementation.  No rash.  Physical exam unremarkable.  Likely muscle strain given no other s/s.  Patient did have a dirty UA with +leukocyte esterase and many bacteria but without dysuria or other urinary symptoms.  -continue conservative measures including ibuprofen (SCr OK) and tramadol as needed -continue gabapentin  -use heating pad on low setting for relief -gave prescription for flexeril 5mg  #10 no refills, to be taken at bedtime as needed -would consider imaging in 6 weeks if no improvement  -obtain urine cx -needs DEXA scan  -will call pt and verify if having any urinary symptoms and will treat with abx accordingly

## 2014-01-24 ENCOUNTER — Other Ambulatory Visit: Payer: Self-pay | Admitting: Internal Medicine

## 2014-01-24 ENCOUNTER — Other Ambulatory Visit: Payer: Self-pay | Admitting: *Deleted

## 2014-01-24 ENCOUNTER — Telehealth: Payer: Self-pay | Admitting: *Deleted

## 2014-01-24 NOTE — Addendum Note (Signed)
Addended by: Truddie Crumble on: 01/24/2014 08:45 AM   Modules accepted: Orders

## 2014-01-24 NOTE — Telephone Encounter (Signed)
Mrs. Osgood would like to come in in the next few days to get the narcotic contract signed. I explained in very general terms why we needed it and she could not have Tramadol without signing the contract. She has several cyclobenzaprine left and will take those "to hold her off" until she can get in.

## 2014-01-24 NOTE — Telephone Encounter (Signed)
Pt has called multiple times wants her tramadol - 5 times total- wanting her tramadol called in. i have spoken to dr gill, she saw her Friday 8/21, dr gill declines filling the tramadol as she is not the pcp, i paged dr Redmond Pulling and spoke to her, she had ask pt to sign a pain contract and pt refused, please see 7/22 visit, pt did not sign the pain contract at that visit nor did she at most recent visit, pt had cancelled visit w/ dr Redmond Pulling last week and come to a visit with dr gill. The pt on last phone call she stated "would call the head man at [facility] if i need to do that." Rachel Murray has been made aware and will call the pt in early pm.

## 2014-01-25 NOTE — Progress Notes (Signed)
Internal Medicine Clinic Attending Date of visit: 01/21/2014   Case discussed with Dr. Gordy Levan soon after the resident saw the patient.  We reviewed the resident's history and exam and pertinent patient test results.  I agree with the assessment, diagnosis, and plan of care documented in the resident's note.

## 2014-01-26 MED ORDER — TRAMADOL HCL 50 MG PO TABS: ORAL_TABLET | ORAL | Status: DC

## 2014-02-01 ENCOUNTER — Encounter: Payer: Self-pay | Admitting: Internal Medicine

## 2014-02-04 ENCOUNTER — Encounter (HOSPITAL_BASED_OUTPATIENT_CLINIC_OR_DEPARTMENT_OTHER): Payer: Self-pay | Admitting: Emergency Medicine

## 2014-02-04 ENCOUNTER — Emergency Department (HOSPITAL_BASED_OUTPATIENT_CLINIC_OR_DEPARTMENT_OTHER)
Admission: EM | Admit: 2014-02-04 | Discharge: 2014-02-04 | Disposition: A | Discharge status: Home / Self Care | Payer: Medicare Other | Attending: Emergency Medicine | Admitting: Emergency Medicine

## 2014-02-04 ENCOUNTER — Emergency Department (HOSPITAL_BASED_OUTPATIENT_CLINIC_OR_DEPARTMENT_OTHER): Payer: Medicare Other

## 2014-02-04 DIAGNOSIS — Z8541 Personal history of malignant neoplasm of cervix uteri: Secondary | ICD-10-CM | POA: Diagnosis not present

## 2014-02-04 DIAGNOSIS — Z90710 Acquired absence of both cervix and uterus: Secondary | ICD-10-CM | POA: Insufficient documentation

## 2014-02-04 DIAGNOSIS — Z8739 Personal history of other diseases of the musculoskeletal system and connective tissue: Secondary | ICD-10-CM | POA: Diagnosis not present

## 2014-02-04 DIAGNOSIS — Z79899 Other long term (current) drug therapy: Secondary | ICD-10-CM | POA: Diagnosis not present

## 2014-02-04 DIAGNOSIS — Z862 Personal history of diseases of the blood and blood-forming organs and certain disorders involving the immune mechanism: Secondary | ICD-10-CM | POA: Diagnosis not present

## 2014-02-04 DIAGNOSIS — N390 Urinary tract infection, site not specified: Secondary | ICD-10-CM | POA: Insufficient documentation

## 2014-02-04 DIAGNOSIS — Z8619 Personal history of other infectious and parasitic diseases: Secondary | ICD-10-CM | POA: Insufficient documentation

## 2014-02-04 DIAGNOSIS — J45909 Unspecified asthma, uncomplicated: Secondary | ICD-10-CM | POA: Insufficient documentation

## 2014-02-04 DIAGNOSIS — Z87828 Personal history of other (healed) physical injury and trauma: Secondary | ICD-10-CM | POA: Insufficient documentation

## 2014-02-04 DIAGNOSIS — R109 Unspecified abdominal pain: Secondary | ICD-10-CM | POA: Diagnosis not present

## 2014-02-04 DIAGNOSIS — K5730 Diverticulosis of large intestine without perforation or abscess without bleeding: Secondary | ICD-10-CM | POA: Diagnosis not present

## 2014-02-04 LAB — URINALYSIS, ROUTINE W REFLEX MICROSCOPIC
Bilirubin Urine: NEGATIVE
Glucose, UA: NEGATIVE mg/dL
Hgb urine dipstick: NEGATIVE
Ketones, ur: NEGATIVE mg/dL
NITRITE: NEGATIVE
PROTEIN: NEGATIVE mg/dL
SPECIFIC GRAVITY, URINE: 1.011 (ref 1.005–1.030)
UROBILINOGEN UA: 1 mg/dL (ref 0.0–1.0)
pH: 6 (ref 5.0–8.0)

## 2014-02-04 LAB — URINE MICROSCOPIC-ADD ON

## 2014-02-04 MED ORDER — CEPHALEXIN 500 MG PO CAPS: 500.0000 mg | ORAL_CAPSULE | Freq: Four times a day (QID) | ORAL | Status: DC

## 2014-02-04 MED ORDER — ACETAMINOPHEN 325 MG PO TABS
650.0000 mg | ORAL_TABLET | Freq: Once | ORAL | Status: AC
  Administered 2014-02-04: 650 mg via ORAL
  Filled 2014-02-04: qty 2

## 2014-02-04 NOTE — Discharge Instructions (Signed)
Pyelonephritis, Adult °Pyelonephritis is a kidney infection. In general, there are 2 main types of pyelonephritis: °· Infections that come on quickly without any warning (acute pyelonephritis). °· Infections that persist for a long period of time (chronic pyelonephritis). °CAUSES  °Two main causes of pyelonephritis are: °· Bacteria traveling from the bladder to the kidney. This is a problem especially in pregnant women. The urine in the bladder can become filled with bacteria from multiple causes, including: °¨ Inflammation of the prostate gland (prostatitis). °¨ Sexual intercourse in females. °¨ Bladder infection (cystitis). °· Bacteria traveling from the bloodstream to the tissue part of the kidney. °Problems that may increase your risk of getting a kidney infection include: °· Diabetes. °· Kidney stones or bladder stones. °· Cancer. °· Catheters placed in the bladder. °· Other abnormalities of the kidney or ureter. °SYMPTOMS  °· Abdominal pain. °· Pain in the side or flank area. °· Fever. °· Chills. °· Upset stomach. °· Blood in the urine (dark urine). °· Frequent urination. °· Strong or persistent urge to urinate. °· Burning or stinging when urinating. °DIAGNOSIS  °Your caregiver may diagnose your kidney infection based on your symptoms. A urine sample may also be taken. °TREATMENT  °In general, treatment depends on how severe the infection is.  °· If the infection is mild and caught early, your caregiver may treat you with oral antibiotics and send you home. °· If the infection is more severe, the bacteria may have gotten into the bloodstream. This will require intravenous (IV) antibiotics and a hospital stay. Symptoms may include: °¨ High fever. °¨ Severe flank pain. °¨ Shaking chills. °· Even after a hospital stay, your caregiver may require you to be on oral antibiotics for a period of time. °· Other treatments may be required depending upon the cause of the infection. °HOME CARE INSTRUCTIONS  °· Take your  antibiotics as directed. Finish them even if you start to feel better. °· Make an appointment to have your urine checked to make sure the infection is gone. °· Drink enough fluids to keep your urine clear or pale yellow. °· Take medicines for the bladder if you have urgency and frequency of urination as directed by your caregiver. °SEEK IMMEDIATE MEDICAL CARE IF:  °· You have a fever or persistent symptoms for more than 2-3 days. °· You have a fever and your symptoms suddenly get worse. °· You are unable to take your antibiotics or fluids. °· You develop shaking chills. °· You experience extreme weakness or fainting. °· There is no improvement after 2 days of treatment. °MAKE SURE YOU: °· Understand these instructions. °· Will watch your condition. °· Will get help right away if you are not doing well or get worse. °Document Released: 05/20/2005 Document Revised: 11/19/2011 Document Reviewed: 10/24/2010 °ExitCare® Patient Information ©2015 [employer]. This information is not intended to replace advice given to you by your health care provider. Make sure you discuss any questions you have with your health care provider. ° °
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
## 2014-02-04 NOTE — ED Notes (Signed)
Patient transported to CT 

## 2014-02-04 NOTE — ED Notes (Signed)
C/o left flank pain and pressure. Denies dysuria.

## 2014-02-04 NOTE — ED Provider Notes (Signed)
Medical screening examination/treatment/procedure(s) were conducted as a shared visit with non-physician practitioner(s) and myself.  I personally evaluated the patient during the encounter.   EKG Interpretation None     Urinary frequency with left flank pain with left CVA tenderness with many bacteria with few epithelial cells few reasonable to treat outpatient for possible UTI/pyelonephritis.  Babette Relic, MD 02/05/14 [phone]

## 2014-02-06 LAB — URINE CULTURE

## 2014-02-08 ENCOUNTER — Telehealth (HOSPITAL_BASED_OUTPATIENT_CLINIC_OR_DEPARTMENT_OTHER): Payer: Self-pay

## 2014-02-08 NOTE — Telephone Encounter (Signed)
Post ED Visit - Positive Culture Follow-up  Culture report reviewed by antimicrobial stewardship pharmacist: [x]  Wes Medley, Pharm.D., BCPS []  Heide Guile, Pharm.D., BCPS []  Alycia Rossetti, Pharm.D., BCPS []  [place], Pharm.D., BCPS, AAHIVP []  Legrand Como, Pharm.D., BCPS, AAHIVP []  Carly Sabat, Pharm.D. []  Elenor Quinones, Pharm.D.  Positive Urine culture, >/= 100,000 colonies -> Klebsiella Pneumoniae Treated with Cephalexin, organism sensitive to the same and no further patient follow-up is required at this time.  Dortha Kern 02/08/2014, 5:03 AM

## 2014-02-14 NOTE — Addendum Note (Signed)
Addended by: Hulan Fray on: 02/14/2014 07:36 PM   Modules accepted: Orders

## 2014-02-23 ENCOUNTER — Ambulatory Visit (HOSPITAL_COMMUNITY): Payer: Medicare Other

## 2014-02-24 ENCOUNTER — Other Ambulatory Visit: Payer: Self-pay | Admitting: *Deleted

## 2014-02-24 MED ORDER — TRAMADOL HCL 50 MG PO TABS: ORAL_TABLET | ORAL | Status: DC

## 2014-02-24 MED ORDER — GABAPENTIN 300 MG PO CAPS: 300.0000 mg | ORAL_CAPSULE | Freq: Three times a day (TID) | ORAL | Status: DC

## 2014-02-24 NOTE — Telephone Encounter (Signed)
Rx called in 

## 2014-03-07 ENCOUNTER — Ambulatory Visit (HOSPITAL_COMMUNITY): Payer: Medicare Other

## 2014-03-11 ENCOUNTER — Ambulatory Visit (HOSPITAL_COMMUNITY): Admission: RE | Admit: 2014-03-11 | Payer: Medicare Other | Source: Ambulatory Visit

## 2014-03-18 ENCOUNTER — Other Ambulatory Visit: Payer: Self-pay

## 2014-03-28 ENCOUNTER — Other Ambulatory Visit: Payer: Self-pay | Admitting: *Deleted

## 2014-03-28 DIAGNOSIS — S39012D Strain of muscle, fascia and tendon of lower back, subsequent encounter: Secondary | ICD-10-CM

## 2014-03-30 MED ORDER — TRAMADOL HCL 50 MG PO TABS: ORAL_TABLET | ORAL | Status: DC

## 2014-03-31 NOTE — Telephone Encounter (Signed)
Called to pharm 

## 2014-04-27 ENCOUNTER — Other Ambulatory Visit: Payer: Self-pay | Admitting: Internal Medicine

## 2014-04-27 DIAGNOSIS — S39012D Strain of muscle, fascia and tendon of lower back, subsequent encounter: Secondary | ICD-10-CM

## 2014-05-02 NOTE — Telephone Encounter (Signed)
Rx called in to pharmacy. 

## 2014-05-30 ENCOUNTER — Other Ambulatory Visit: Payer: Self-pay | Admitting: *Deleted

## 2014-06-01 ENCOUNTER — Ambulatory Visit (INDEPENDENT_AMBULATORY_CARE_PROVIDER_SITE_OTHER): Payer: Medicare Other | Admitting: Internal Medicine

## 2014-06-01 ENCOUNTER — Encounter: Payer: Self-pay | Admitting: Internal Medicine

## 2014-06-01 VITALS — BP 133/73 | HR 81 | Temp 98.0°F | Ht 67.0 in | Wt 144.2 lb

## 2014-06-01 DIAGNOSIS — E559 Vitamin D deficiency, unspecified: Secondary | ICD-10-CM | POA: Diagnosis not present

## 2014-06-01 DIAGNOSIS — S39012D Strain of muscle, fascia and tendon of lower back, subsequent encounter: Secondary | ICD-10-CM | POA: Diagnosis not present

## 2014-06-01 DIAGNOSIS — M5430 Sciatica, unspecified side: Secondary | ICD-10-CM

## 2014-06-01 DIAGNOSIS — M5441 Lumbago with sciatica, right side: Secondary | ICD-10-CM

## 2014-06-01 DIAGNOSIS — E538 Deficiency of other specified B group vitamins: Secondary | ICD-10-CM | POA: Diagnosis not present

## 2014-06-01 MED ORDER — CYANOCOBALAMIN 1000 MCG/ML IJ SOLN
1000.0000 ug | Freq: Once | INTRAMUSCULAR | Status: AC
  Administered 2014-06-01: 1000 ug via INTRAMUSCULAR

## 2014-06-01 MED ORDER — GABAPENTIN 300 MG PO CAPS: 300.0000 mg | ORAL_CAPSULE | Freq: Three times a day (TID) | ORAL | Status: DC

## 2014-06-01 MED ORDER — TRAMADOL HCL 50 MG PO TABS: 50.0000 mg | ORAL_TABLET | Freq: Three times a day (TID) | ORAL | Status: DC | PRN

## 2014-06-01 MED ORDER — CYANOCOBALAMIN 1000 MCG/ML IJ SOLN: 1000.0000 ug | Freq: Once | INTRAMUSCULAR | Status: DC

## 2014-06-01 MED ORDER — TIZANIDINE HCL 2 MG PO TABS: 2.0000 mg | ORAL_TABLET | Freq: Every evening | ORAL | Status: DC | PRN

## 2014-06-01 NOTE — Patient Instructions (Signed)
General Instructions: Please stop taking Flerexil  Please start taking Zanaflex 2-4 mg at bedtime as needed We will check your vitamin b12 Please follow up with Dr Redmond Pulling in a month  Happy new year!  Please bring your medicines with you each time you come to clinic.  Medicines may include prescription medications, over-the-counter medications, herbal remedies, eye drops, vitamins, or other pills.   Progress Toward Treatment Goals:  No flowsheet data found.  Self Care Goals & Plans:  Self Care Goal 12/16/2012  Manage my medications take my medicines as prescribed; refill my medications on time  Monitor my health (No Data)  Eat healthy foods eat more vegetables; eat foods that are low in salt  Be physically active (No Data)    No flowsheet data found.   Care Management & Community Referrals:  No flowsheet data found.

## 2014-06-01 NOTE — Progress Notes (Signed)
Internal Medicine Clinic Attending  Case discussed with Dr. Kazibwe at the time of the visit.  We reviewed the resident's history and exam and pertinent patient test results.  I agree with the assessment, diagnosis, and plan of care documented in the resident's note. 

## 2014-06-02 LAB — VITAMIN B12: Vitamin B-12: >2000 pg/mL — ABNORMAL HIGH (ref 211–911)

## 2014-06-02 NOTE — Addendum Note (Signed)
Addended by: Jessee Avers on: 06/02/2014 05:15 PM   Modules accepted: Level of Service

## 2014-06-02 NOTE — Assessment & Plan Note (Signed)
Patient reports increased numbness in the right lower extremity. She is able to ambulate I only appreciated mild reduction in the strength in the right side. No other neurological deficits. She demands for a vitamin B-12 injection. I indicated to her that her insurance may not pay for this and she may not require the tendon B-12 shots since her vitamin B12 levels of been stable previously. Plan -Refill tramadol and Zanaflex as above -Give IM vitamin B-12 1000 milligrams. Will check B12 level today as well

## 2014-06-02 NOTE — Assessment & Plan Note (Signed)
She states that her pain is slightly increased since being out of Flexeril. I don't appreciate any concerning new oral complications currently. Plan -Refill tramadol. -Switch Flexeril to Zanaflex at a small dose of 2-4 mg at bedtime daily at bedtime as needed. Zanaflex is probably better tolerated and safer in the elderly than Flexeril.  -Follow-up with PCP

## 2014-06-02 NOTE — Assessment & Plan Note (Signed)
Patient insists on vitamin B-12 shot. Will administer vitamin B-12 and check B12 level.

## 2014-06-02 NOTE — Progress Notes (Signed)
Patient ID: Rachel Murray, female   DOB: 10/29/1945, 68 y.o.   MRN: [id number]   Subjective:   HPI: [name] is a 68 y.o. old woman with past medical history of asthma, vitamin B12 deficiencies, degenerative disc disease, among other problems, presents for routine follow-up visit for chronic pain.  Reason(s) for this visit: Chronic pain: Patient has a history of chronic low back pain. She takes Flexeril and tramadol. Recently she notes some increase in her right lower extremity pain associated with thigh numbness for 2 months. She reports that she had a fall in October 2015, which was mechanical. She had no injuries from this fall. She has not had any other extremity weaknesses. She has no bladder or bowel control problems. Patient reportedly she's been out of Flexeril and her pain has somehow increases since being off Flexeril. Chart review reveals that she used to take 5 mg of Flexeril daily at bedtime when necessary. No other complaints besides requests for vitamin B 12 shots as they have helped her previously with her chronic pain problems.   ROS: Constitutional: Denies fever, chills, diaphoresis, appetite change and fatigue.  Respiratory: Denies SOB, DOE, cough, chest tightness, and wheezing. Denies chest pain. CVS: No chest pain, palpitations and leg swelling.  GI: No abdominal pain, nausea, vomiting, bloody stools GU: No dysuria, frequency, hematuria, or flank pain.  MSK: No myalgias, joint swelling, arthralgias, chronic low back pain.  Psych: No depression symptoms. No SI or SA.    Objective:  Physical Exam: Filed Vitals:   06/01/14 0904  BP: 133/73  Pulse: 81  Temp: 98 F (36.7 C)  TempSrc: Oral  Height: 5\' 7"  (1.702 m)  Weight: 144 lb 3.2 oz (65.409 kg)  SpO2: 100%   General: Well nourished. No acute distress. Able to get on the couch without assistance. HEENT: Normal oral mucosa. MMM.  Lungs: CTA bilaterally. Heart: RRR; no extra sounds or murmurs  Abdomen:  Non-distended, normal bowel sounds, soft, nontender; no hepatosplenomegaly  Extremities: No pedal edema. No joint swelling or tenderness. Back: tenderness in the lumber region. Neurologic: Normal EOM,  Alert and oriented x3. No obvious cranial nerve deficits. Slightly reduced strength in the right lower extremity but without sensory neuro deficits, reflexes are equal and present bilaterally Left hand grip is normal, but weak on the right (this is chronic)  Assessment & Plan:  Discussed case with my attending in the clinic, Dr. Ellwood Dense See problem based charting.

## 2014-06-29 ENCOUNTER — Ambulatory Visit (INDEPENDENT_AMBULATORY_CARE_PROVIDER_SITE_OTHER): Payer: Medicare Other | Admitting: Internal Medicine

## 2014-06-29 ENCOUNTER — Encounter: Payer: Self-pay | Admitting: Internal Medicine

## 2014-06-29 VITALS — BP 114/67 | HR 85 | Temp 98.0°F | Ht 66.5 in | Wt 137.6 lb

## 2014-06-29 DIAGNOSIS — R198 Other specified symptoms and signs involving the digestive system and abdomen: Secondary | ICD-10-CM | POA: Insufficient documentation

## 2014-06-29 DIAGNOSIS — K5900 Constipation, unspecified: Secondary | ICD-10-CM | POA: Diagnosis not present

## 2014-06-29 DIAGNOSIS — J45991 Cough variant asthma: Secondary | ICD-10-CM | POA: Diagnosis not present

## 2014-06-29 DIAGNOSIS — Z1211 Encounter for screening for malignant neoplasm of colon: Secondary | ICD-10-CM | POA: Diagnosis not present

## 2014-06-29 DIAGNOSIS — E559 Vitamin D deficiency, unspecified: Secondary | ICD-10-CM

## 2014-06-29 DIAGNOSIS — Z23 Encounter for immunization: Secondary | ICD-10-CM | POA: Diagnosis not present

## 2014-06-29 DIAGNOSIS — Z1231 Encounter for screening mammogram for malignant neoplasm of breast: Secondary | ICD-10-CM | POA: Diagnosis not present

## 2014-06-29 DIAGNOSIS — M5136 Other intervertebral disc degeneration, lumbar region: Secondary | ICD-10-CM

## 2014-06-29 DIAGNOSIS — R102 Pelvic and perineal pain: Secondary | ICD-10-CM | POA: Diagnosis not present

## 2014-06-29 DIAGNOSIS — Z0000 Encounter for general adult medical examination without abnormal findings: Secondary | ICD-10-CM

## 2014-06-29 MED ORDER — TRAMADOL HCL 50 MG PO TABS: 50.0000 mg | ORAL_TABLET | Freq: Three times a day (TID) | ORAL | Status: DC | PRN

## 2014-06-29 NOTE — Assessment & Plan Note (Signed)
Referred for mammogram and colonoscopy today.  Flu vaccine given today.  Will wait to refer for DEXA and give rx for shingles at next visit because I am placing several referrals today and do not want to overwhelm her.

## 2014-06-29 NOTE — Assessment & Plan Note (Addendum)
Will check vitamin D level today.  ADDENDUM:  Vitamin D is 14.  Rx sent for vitamin D 50,000 units qweekly x 6 weeks.  Will follow with OTC vitamin D 800 units daily and plan to recheck levels in 10-12 weeks.

## 2014-06-29 NOTE — Progress Notes (Signed)
   Subjective:    Patient ID: Rachel Murray, female    DOB: [birth year], 69 y.o.   MRN: [id number]  HPI Comments: Rachel Murray is a 69 year old with a PMH of asthma, vitamin B12 deficiency and DDD who presents for follow-up of chronic medical conditions and acute c/o of rectal pain with defecation.  Please see problem based charting for update of these conditions.           Review of Systems  Constitutional: Negative for fever, chills, appetite change and unexpected weight change.  Eyes: Negative for visual disturbance.  Respiratory: Negative for cough and shortness of breath.   Cardiovascular: Negative for chest pain, palpitations and leg swelling.  Gastrointestinal: Positive for abdominal pain and rectal pain. Negative for nausea, vomiting, diarrhea, constipation and blood in stool.       Abd/rectal pain with defecation x 1 month.  Genitourinary: Positive for pelvic pain. Negative for dysuria, hematuria, vaginal bleeding, vaginal discharge and vaginal pain.       Occasional right sided pelvic pain that feels like menstrual cramps, occurs every few months.  Neurological: Negative for syncope, weakness and light-headedness.  Psychiatric/Behavioral: Negative for dysphoric mood.       Filed Vitals:   06/29/14 1503  BP: 114/67  Pulse: 85  Temp: 98 F (36.7 C)   Objective:   Physical Exam  Constitutional: She is oriented to person, place, and time. She appears well-developed. No distress.  HENT:  Head: Normocephalic and atraumatic.  Mouth/Throat: Oropharynx is clear and moist. No oropharyngeal exudate.  Eyes: EOM are normal. Pupils are equal, round, and reactive to light.  Neck: Neck supple.  Cardiovascular: Normal rate, regular rhythm and normal heart sounds.  Exam reveals no gallop and no friction rub.   No murmur heard. Pulmonary/Chest: Effort normal and breath sounds normal. No respiratory distress. She has no wheezes. She has no rales.  Abdominal: Soft. Bowel sounds are  normal. She exhibits no distension. There is no tenderness. There is no rebound.  She notes pressure when I push on the LLQ, but denies pain.  No suprapubic discomfort.  Genitourinary:  No external hemorrhoid, fissure or blood noted.  Musculoskeletal: Normal range of motion. She exhibits no edema or tenderness.  No CVAT; negative straight leg raise B/L.  Neurological: She is alert and oriented to person, place, and time. No cranial nerve deficit.  Skin: Skin is warm. She is not diaphoretic.  Psychiatric: She has a normal mood and affect. Her behavior is normal.  Vitals reviewed.         Assessment & Plan:  Please see problem based assessment and plan.

## 2014-06-29 NOTE — Assessment & Plan Note (Addendum)
She has c/o of abd/rectal pain since New Year's (about 1 month).  Periumbilical that radiates to rectum with defecation that has gradually worsened over the past month and is now 10/10 at its worse.  This occurs with both soft and hard stools.  Pain completely relieved after BM.  No bloody or dark stools.  No unexplained weight loss.   Appetite is good, weight is stable.  She does report some indigestion - reports increased fatty food intake and not eating her usual diet because she has had to travel out of state due to recent deaths in family.  No diarrhea.  Constipation, BM q2-3 days since she started Tramadol but the rectal pain is new.  Increased stress recently - lost two family members and son was in a car accident.  Notices occasional right sided low pelvic pain every few months.  No vaginal bleeding/discharge.  She has hx of cervical cancer in 1979 (s/p partial hysterectomy).  Fam hx of breast cancer (sister died of breast cancer at age of 30), no ovarian cancer hx in family.  Benign abdominal exam. No evidence of external hemorrhoid, fissure or bleeding on exam making hemorrhoid or fissure less likely.  No tenesmus, mucous in stool or IBS hx to suggest IBS.  Lack of tenesmus and lack of vaginal discomfort also makes rectocele less likely. - CMP, CBC today - will refer to GI for colonoscopy to evaluate for colon CA, hemorrhoids, fissures, polyps - will refer to OBGyn for pelvic exam and transvaginal US to evaluate for ovarian malignancy/GYN pathology given her cervical cancer hx and current symptoms; she says she had a transvaginal US at a hospital in [place], [place] last year but denies symptoms at that time - she was advised to increase water/fiber/activity and prn OTC Miralax to help with constipation (likely opioid-related and exacerbated by recent dietary changes), Tux pads prn for local pain relief - RTC in 1 month for follow-up - She agrees to above plan.

## 2014-06-29 NOTE — Assessment & Plan Note (Signed)
No dyspnea.  Rarely uses inhaler.

## 2014-06-29 NOTE — Patient Instructions (Signed)
1. We will call you with appointment for colonoscopy, OBGyn and mammogram.  Please be sure to follow-up with these screenings even if your pain gets better.  You can try miralax, increase water and fiber, light exercise (walking) to help with constipation.  Tucks pads may help with pain after defecation.  2. Please take all medications as prescribed.    3. If you have worsening of your symptoms or new symptoms arise, please call the clinic ([phone]), or go to the ER immediately if symptoms are severe.  Please come back to see me in 1 month or sooner if symptoms worsen.

## 2014-06-29 NOTE — Assessment & Plan Note (Addendum)
Rachel Murray did sign a new pain contract on 01/26/14.  UDS appropriately positive for Tramadol in 12/2013.  She continues to have low back pain.  No increased pain, no change in pain quality, no loss of bowel or bladder control.  She reports taking Tramadol helps her pain and allows her to do her daily activities.  She is currently taking 50mg  q8h and feels she needs this amount for adequate pain control.  She is also taking Gabapentin 300mg  TID.  She has not been to PT and is agreeable to referral.  - refill given for Tramadol 50mg , take 1 tablet q8h prn, #90 no refills - Gabapentin was already e-prescribed and she should have plenty of refills - referral for PT to work on muscle strengthening

## 2014-06-30 ENCOUNTER — Telehealth: Payer: Self-pay | Admitting: *Deleted

## 2014-06-30 ENCOUNTER — Other Ambulatory Visit: Payer: Self-pay | Admitting: Internal Medicine

## 2014-06-30 ENCOUNTER — Encounter: Payer: Self-pay | Admitting: *Deleted

## 2014-06-30 DIAGNOSIS — E559 Vitamin D deficiency, unspecified: Secondary | ICD-10-CM

## 2014-06-30 LAB — CBC
HCT: 31 % — ABNORMAL LOW (ref 36.0–46.0)
Hemoglobin: 10.3 g/dL — ABNORMAL LOW (ref 12.0–15.0)
MCH: 32.3 pg (ref 26.0–34.0)
MCHC: 33.2 g/dL (ref 30.0–36.0)
MCV: 97.2 fL (ref 78.0–100.0)
MPV: 9.4 fL (ref 8.6–12.4)
Platelets: 221 10*3/uL (ref 150–400)
RBC: 3.19 MIL/uL — AB (ref 3.87–5.11)
RDW: 13.4 % (ref 11.5–15.5)
WBC: 6.3 10*3/uL (ref 4.0–10.5)

## 2014-06-30 LAB — COMPLETE METABOLIC PANEL WITH GFR
ALT: 8 U/L (ref 0–35)
AST: 15 U/L (ref 0–37)
Albumin: 3.9 g/dL (ref 3.5–5.2)
Alkaline Phosphatase: 72 U/L (ref 39–117)
BILIRUBIN TOTAL: 0.4 mg/dL (ref 0.2–1.2)
BUN: 16 mg/dL (ref 6–23)
CHLORIDE: 102 meq/L (ref 96–112)
CO2: 26 meq/L (ref 19–32)
Calcium: 8.8 mg/dL (ref 8.4–10.5)
Creat: 1.29 mg/dL — ABNORMAL HIGH (ref 0.50–1.10)
GFR, EST AFRICAN AMERICAN: 50 mL/min — AB
GFR, EST NON AFRICAN AMERICAN: 43 mL/min — AB
Glucose, Bld: 96 mg/dL (ref 70–99)
POTASSIUM: 4 meq/L (ref 3.5–5.3)
Sodium: 140 mEq/L (ref 135–145)
Total Protein: 6.8 g/dL (ref 6.0–8.3)

## 2014-06-30 LAB — VITAMIN D 25 HYDROXY (VIT D DEFICIENCY, FRACTURES): Vit D, 25-Hydroxy: 14 ng/mL — ABNORMAL LOW (ref 30–100)

## 2014-06-30 MED ORDER — VITAMIN D (ERGOCALCIFEROL) 1.25 MG (50000 UNIT) PO CAPS: 50000.0000 [IU] | ORAL_CAPSULE | ORAL | Status: DC

## 2014-06-30 NOTE — Progress Notes (Signed)
Internal Medicine Clinic Attending  Case discussed with Dr. Wilson soon after the resident saw the patient.  We reviewed the resident's history and exam and pertinent patient test results.  I agree with the assessment, diagnosis, and plan of care documented in the resident's note.  

## 2014-06-30 NOTE — Addendum Note (Signed)
Addended by: Sander Nephew F on: 06/30/2014 10:04 AM   Modules accepted: Orders

## 2014-06-30 NOTE — Telephone Encounter (Signed)
Call to patient to give her appointments for her Mammogram and Pelvic Ultrasound.  Message left with daughter with appointment on 2/4/201 3:15 PM for Mammogram and Ultrasound to follow at 3:30 PM.  Daughter voiced understanding of the appointments and will inform patient.  Letter was also sent with appointment and need to bring fluids to drink water prior to the Ultrasound. Also given number to call to reschedule if needed.  Sander Nephew, RN 06/30/2014 2:27 PM

## 2014-07-01 ENCOUNTER — Telehealth: Payer: Self-pay | Admitting: *Deleted

## 2014-07-01 NOTE — Telephone Encounter (Signed)
Call to patient and daughter to inform patient of the need to take Vitamin D per Dr. Redmond Pulling.  Message left on patient's cell phone to call the Clinics and to ask for [facility].  Call to home number spoke with [place]-patients daughter. Given instructions that Vit D 50,000 unit prescription had been sent to patient's pharmacy in [place].  Patient is to take 1 pill 1 per week for 6 weeks then start OTC Vitamin D 800 units daily.  Patient is to stop all OTC supplements while on the high dose Vitamin D.  Patient is to have labs repeated in 3 months .  Daughter repeated information and voiced understanding and is to have patient call the Clinics if needed.   Daughter was also informed that when patient has visit with GI doctor appointment will be set for Colonoscopy then.   Regino Schultze Niobe Dick,RN 07/01/2014 11:31 AM

## 2014-07-05 ENCOUNTER — Encounter: Payer: Self-pay | Admitting: Obstetrics & Gynecology

## 2014-07-07 ENCOUNTER — Other Ambulatory Visit: Payer: Self-pay | Admitting: Internal Medicine

## 2014-07-07 ENCOUNTER — Ambulatory Visit (HOSPITAL_COMMUNITY)
Admission: RE | Admit: 2014-07-07 | Discharge: 2014-07-07 | Disposition: A | Discharge status: Home / Self Care | Payer: Medicare Other | Source: Ambulatory Visit | Attending: Internal Medicine | Admitting: Internal Medicine

## 2014-07-07 DIAGNOSIS — R102 Pelvic and perineal pain: Secondary | ICD-10-CM | POA: Diagnosis not present

## 2014-07-07 DIAGNOSIS — R198 Other specified symptoms and signs involving the digestive system and abdomen: Secondary | ICD-10-CM

## 2014-07-07 DIAGNOSIS — Z1231 Encounter for screening mammogram for malignant neoplasm of breast: Secondary | ICD-10-CM | POA: Diagnosis not present

## 2014-07-08 ENCOUNTER — Other Ambulatory Visit: Payer: Self-pay | Admitting: Internal Medicine

## 2014-07-08 DIAGNOSIS — R7989 Other specified abnormal findings of blood chemistry: Secondary | ICD-10-CM

## 2014-07-11 ENCOUNTER — Encounter: Payer: Self-pay | Admitting: *Deleted

## 2014-07-13 ENCOUNTER — Encounter: Payer: Self-pay | Admitting: *Deleted

## 2014-07-14 ENCOUNTER — Telehealth: Payer: Self-pay | Admitting: Internal Medicine

## 2014-07-14 ENCOUNTER — Telehealth: Payer: Self-pay | Admitting: *Deleted

## 2014-07-14 NOTE — Telephone Encounter (Signed)
Call to patient to inform her of need for lab work.  Patient said that she will come in on Monday.  Is currently out of town.  Patient also said that she has not scheduled  an appointment will do next week.  Sander Nephew, RN 07/14/2014 2:40 PM.

## 2014-07-14 NOTE — Telephone Encounter (Signed)
Call to patient to confirm appointment for 2/12 at 10:00 lmtcb

## 2014-07-15 ENCOUNTER — Other Ambulatory Visit: Payer: Medicare Other

## 2014-07-15 ENCOUNTER — Encounter: Payer: Self-pay | Admitting: Gastroenterology

## 2014-07-18 ENCOUNTER — Other Ambulatory Visit: Payer: Medicare Other

## 2014-07-19 ENCOUNTER — Other Ambulatory Visit (INDEPENDENT_AMBULATORY_CARE_PROVIDER_SITE_OTHER): Payer: Medicare Other

## 2014-07-19 DIAGNOSIS — R7989 Other specified abnormal findings of blood chemistry: Secondary | ICD-10-CM

## 2014-07-19 DIAGNOSIS — R748 Abnormal levels of other serum enzymes: Secondary | ICD-10-CM

## 2014-07-19 LAB — BASIC METABOLIC PANEL WITH GFR
BUN: 11 mg/dL (ref 6–23)
CHLORIDE: 106 meq/L (ref 96–112)
CO2: 29 mEq/L (ref 19–32)
Calcium: 8.9 mg/dL (ref 8.4–10.5)
Creat: 0.79 mg/dL (ref 0.50–1.10)
GFR, Est Non African American: 78 mL/min
GLUCOSE: 88 mg/dL (ref 70–99)
POTASSIUM: 4.5 meq/L (ref 3.5–5.3)
Sodium: 141 mEq/L (ref 135–145)

## 2014-08-01 ENCOUNTER — Other Ambulatory Visit: Payer: Self-pay | Admitting: *Deleted

## 2014-08-01 DIAGNOSIS — M5136 Other intervertebral disc degeneration, lumbar region: Secondary | ICD-10-CM

## 2014-08-01 MED ORDER — TRAMADOL HCL 50 MG PO TABS: 50.0000 mg | ORAL_TABLET | Freq: Three times a day (TID) | ORAL | Status: DC | PRN

## 2014-08-01 NOTE — Telephone Encounter (Signed)
Rx called in 

## 2014-08-08 ENCOUNTER — Ambulatory Visit (HOSPITAL_COMMUNITY): Payer: Medicare Other

## 2014-08-09 ENCOUNTER — Telehealth: Payer: Self-pay | Admitting: Internal Medicine

## 2014-08-09 NOTE — Telephone Encounter (Signed)
Call to patient to confirm appointment for 08/10/14 at 2:15. lmtcb

## 2014-08-09 NOTE — Addendum Note (Signed)
Addended by: Hulan Fray on: 08/09/2014 05:44 PM   Modules accepted: Orders

## 2014-08-10 ENCOUNTER — Encounter: Payer: Self-pay | Admitting: Internal Medicine

## 2014-08-10 ENCOUNTER — Telehealth: Payer: Self-pay | Admitting: *Deleted

## 2014-08-10 ENCOUNTER — Ambulatory Visit (INDEPENDENT_AMBULATORY_CARE_PROVIDER_SITE_OTHER): Payer: Medicare Other | Admitting: Internal Medicine

## 2014-08-10 VITALS — BP 109/79 | HR 86 | Temp 98.2°F | Ht 67.0 in | Wt 139.5 lb

## 2014-08-10 DIAGNOSIS — M609 Myositis, unspecified: Secondary | ICD-10-CM | POA: Diagnosis not present

## 2014-08-10 DIAGNOSIS — R202 Paresthesia of skin: Secondary | ICD-10-CM | POA: Diagnosis not present

## 2014-08-10 DIAGNOSIS — M79609 Pain in unspecified limb: Secondary | ICD-10-CM | POA: Diagnosis not present

## 2014-08-10 DIAGNOSIS — M791 Myalgia, unspecified site: Secondary | ICD-10-CM

## 2014-08-10 DIAGNOSIS — R198 Other specified symptoms and signs involving the digestive system and abdomen: Secondary | ICD-10-CM

## 2014-08-10 DIAGNOSIS — E559 Vitamin D deficiency, unspecified: Secondary | ICD-10-CM | POA: Diagnosis not present

## 2014-08-10 DIAGNOSIS — IMO0001 Reserved for inherently not codable concepts without codable children: Secondary | ICD-10-CM

## 2014-08-10 LAB — CBC
HCT: 31.7 % — ABNORMAL LOW (ref 36.0–46.0)
Hemoglobin: 10.4 g/dL — ABNORMAL LOW (ref 12.0–15.0)
MCH: 32.5 pg (ref 26.0–34.0)
MCHC: 32.8 g/dL (ref 30.0–36.0)
MCV: 99.1 fL (ref 78.0–100.0)
MPV: 8.5 fL — ABNORMAL LOW (ref 8.6–12.4)
PLATELETS: 251 10*3/uL (ref 150–400)
RBC: 3.2 MIL/uL — ABNORMAL LOW (ref 3.87–5.11)
RDW: 14.2 % (ref 11.5–15.5)
WBC: 5 10*3/uL (ref 4.0–10.5)

## 2014-08-10 LAB — COMPLETE METABOLIC PANEL WITH GFR
ALT: 9 U/L (ref 0–35)
AST: 14 U/L (ref 0–37)
Albumin: 3.7 g/dL (ref 3.5–5.2)
Alkaline Phosphatase: 71 U/L (ref 39–117)
BILIRUBIN TOTAL: 0.4 mg/dL (ref 0.2–1.2)
BUN: 10 mg/dL (ref 6–23)
CHLORIDE: 104 meq/L (ref 96–112)
CO2: 27 meq/L (ref 19–32)
Calcium: 8.8 mg/dL (ref 8.4–10.5)
Creat: 0.81 mg/dL (ref 0.50–1.10)
GFR, EST AFRICAN AMERICAN: 88 mL/min
GFR, Est Non African American: 76 mL/min
GLUCOSE: 94 mg/dL (ref 70–99)
POTASSIUM: 4.6 meq/L (ref 3.5–5.3)
Sodium: 139 mEq/L (ref 135–145)
Total Protein: 6.5 g/dL (ref 6.0–8.3)

## 2014-08-10 LAB — TSH: TSH: 1.009 u[IU]/mL (ref 0.350–4.500)

## 2014-08-10 LAB — C-REACTIVE PROTEIN

## 2014-08-10 LAB — CK: CK TOTAL: 109 U/L (ref 7–177)

## 2014-08-10 NOTE — Patient Instructions (Signed)
1. I am checking labs and sending you for xrays of your neck to see if they can help explain your arm symptoms.  I will call you if there are abnormalities.  Please pick up your vitamin D prescription and take one pill every week until you run out.     2. Please take all medications as prescribed.     3. If you have worsening of your symptoms or new symptoms arise, please call the clinic ([phone]), or go to the ER immediately if symptoms are severe.    Return to see me in 3-4 weeks.
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
## 2014-08-10 NOTE — Telephone Encounter (Signed)
Call to patient to come in for Cervical Spine X rays.  Message left on patient's answering machine to call the Clinics and to ask for [facility].  Sander Nephew, RN 08/10/2014 4:42 PM

## 2014-08-11 DIAGNOSIS — R202 Paresthesia of skin: Principal | ICD-10-CM

## 2014-08-11 DIAGNOSIS — M79609 Pain in unspecified limb: Secondary | ICD-10-CM | POA: Insufficient documentation

## 2014-08-11 LAB — SEDIMENTATION RATE: Sed Rate: 32 mm/hr — ABNORMAL HIGH (ref 0–30)

## 2014-08-11 NOTE — Assessment & Plan Note (Addendum)
She says this has resolved.  She had negative pelvic and transvaginal US.  She will see ObGyn on 08/12/14.  She has been referred for colonoscopy but has not yet scheduled. - arrange colonoscopy

## 2014-08-11 NOTE — Progress Notes (Signed)
Medicine attending: Medical history, presenting problems, physical findings, and medications, reviewed with Dr Alex Wilson on the day of the patient visit   and I concur with her evaluation and management plan. 

## 2014-08-11 NOTE — Assessment & Plan Note (Signed)
She never picked up vitamin D prescription because she says she did not get the message.  She agrees to pick this up and start taking 1 pill every week for 6 weeks.  She will need daily OTC supplement after that.  Repeat labs in 3 months.
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
## 2014-08-11 NOTE — Addendum Note (Signed)
Addended by: Hulan Fray on: 08/11/2014 07:42 AM   Modules accepted: Orders

## 2014-08-12 ENCOUNTER — Encounter: Payer: Medicare Other | Admitting: Obstetrics & Gynecology

## 2014-08-12 NOTE — Telephone Encounter (Signed)
RTC from patient.  Informed of need for X rays of Spine per Dr. Redmond Pulling.  Patient voiced understanding of the plan  to go to Xray to have films done.  Sander Nephew, RN 08/12/2014 9:34 AM

## 2014-08-17 ENCOUNTER — Encounter: Payer: Medicare Other | Admitting: Internal Medicine

## 2014-08-17 NOTE — Addendum Note (Signed)
Addended by: Hulan Fray on: 08/17/2014 05:37 PM   Modules accepted: Orders

## 2014-08-18 ENCOUNTER — Encounter: Payer: Medicare Other | Admitting: Internal Medicine

## 2014-08-18 ENCOUNTER — Emergency Department (HOSPITAL_BASED_OUTPATIENT_CLINIC_OR_DEPARTMENT_OTHER)
Admission: EM | Admit: 2014-08-18 | Discharge: 2014-08-19 | Disposition: A | Discharge status: Home / Self Care | Payer: Medicare Other | Attending: Emergency Medicine | Admitting: Emergency Medicine

## 2014-08-18 ENCOUNTER — Encounter: Payer: Self-pay | Admitting: Internal Medicine

## 2014-08-18 ENCOUNTER — Encounter (HOSPITAL_BASED_OUTPATIENT_CLINIC_OR_DEPARTMENT_OTHER): Payer: Self-pay | Admitting: *Deleted

## 2014-08-18 DIAGNOSIS — M791 Myalgia, unspecified site: Secondary | ICD-10-CM

## 2014-08-18 DIAGNOSIS — Z8541 Personal history of malignant neoplasm of cervix uteri: Secondary | ICD-10-CM | POA: Diagnosis not present

## 2014-08-18 DIAGNOSIS — R1012 Left upper quadrant pain: Secondary | ICD-10-CM | POA: Diagnosis not present

## 2014-08-18 DIAGNOSIS — Z8619 Personal history of other infectious and parasitic diseases: Secondary | ICD-10-CM | POA: Diagnosis not present

## 2014-08-18 DIAGNOSIS — E559 Vitamin D deficiency, unspecified: Secondary | ICD-10-CM | POA: Insufficient documentation

## 2014-08-18 DIAGNOSIS — Z862 Personal history of diseases of the blood and blood-forming organs and certain disorders involving the immune mechanism: Secondary | ICD-10-CM | POA: Insufficient documentation

## 2014-08-18 DIAGNOSIS — Z79899 Other long term (current) drug therapy: Secondary | ICD-10-CM | POA: Diagnosis not present

## 2014-08-18 DIAGNOSIS — Z7982 Long term (current) use of aspirin: Secondary | ICD-10-CM | POA: Insufficient documentation

## 2014-08-18 DIAGNOSIS — I700 Atherosclerosis of aorta: Secondary | ICD-10-CM | POA: Diagnosis not present

## 2014-08-18 DIAGNOSIS — R52 Pain, unspecified: Secondary | ICD-10-CM

## 2014-08-18 DIAGNOSIS — Z87828 Personal history of other (healed) physical injury and trauma: Secondary | ICD-10-CM | POA: Insufficient documentation

## 2014-08-18 DIAGNOSIS — R109 Unspecified abdominal pain: Secondary | ICD-10-CM | POA: Diagnosis not present

## 2014-08-18 DIAGNOSIS — J45909 Unspecified asthma, uncomplicated: Secondary | ICD-10-CM | POA: Diagnosis not present

## 2014-08-18 DIAGNOSIS — R1032 Left lower quadrant pain: Secondary | ICD-10-CM | POA: Diagnosis not present

## 2014-08-18 LAB — URINALYSIS, ROUTINE W REFLEX MICROSCOPIC
Bilirubin Urine: NEGATIVE
Glucose, UA: NEGATIVE mg/dL
Hgb urine dipstick: NEGATIVE
KETONES UR: NEGATIVE mg/dL
NITRITE: POSITIVE — AB
Protein, ur: NEGATIVE mg/dL
SPECIFIC GRAVITY, URINE: 1.007 (ref 1.005–1.030)
Urobilinogen, UA: 0.2 mg/dL (ref 0.0–1.0)
pH: 6 (ref 5.0–8.0)

## 2014-08-18 LAB — URINE MICROSCOPIC-ADD ON

## 2014-08-18 NOTE — ED Notes (Signed)
Pt reports left flank pain x2 days - pt took tylenol at home w/o relief. Pt denies n/v/d or fever or dysuria.
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
## 2014-08-19 ENCOUNTER — Emergency Department (HOSPITAL_BASED_OUTPATIENT_CLINIC_OR_DEPARTMENT_OTHER): Payer: Medicare Other

## 2014-08-19 ENCOUNTER — Ambulatory Visit (HOSPITAL_COMMUNITY)
Admission: RE | Admit: 2014-08-19 | Discharge: 2014-08-19 | Disposition: A | Discharge status: Home / Self Care | Payer: Medicare Other | Source: Ambulatory Visit | Attending: Oncology | Admitting: Oncology

## 2014-08-19 DIAGNOSIS — M47892 Other spondylosis, cervical region: Secondary | ICD-10-CM | POA: Diagnosis not present

## 2014-08-19 DIAGNOSIS — R109 Unspecified abdominal pain: Secondary | ICD-10-CM | POA: Diagnosis not present

## 2014-08-19 DIAGNOSIS — IMO0001 Reserved for inherently not codable concepts without codable children: Secondary | ICD-10-CM

## 2014-08-19 DIAGNOSIS — M542 Cervicalgia: Secondary | ICD-10-CM | POA: Diagnosis not present

## 2014-08-19 DIAGNOSIS — I700 Atherosclerosis of aorta: Secondary | ICD-10-CM | POA: Diagnosis not present

## 2014-08-19 DIAGNOSIS — M79609 Pain in unspecified limb: Secondary | ICD-10-CM

## 2014-08-19 DIAGNOSIS — R202 Paresthesia of skin: Secondary | ICD-10-CM

## 2014-08-19 DIAGNOSIS — M4802 Spinal stenosis, cervical region: Secondary | ICD-10-CM | POA: Diagnosis not present

## 2014-08-19 DIAGNOSIS — M791 Myalgia: Secondary | ICD-10-CM | POA: Diagnosis not present

## 2014-08-19 MED ORDER — CYCLOBENZAPRINE HCL 10 MG PO TABS: 10.0000 mg | ORAL_TABLET | Freq: Three times a day (TID) | ORAL | Status: DC | PRN

## 2014-08-19 MED ORDER — ACETAMINOPHEN 325 MG PO TABS
ORAL_TABLET | ORAL | Status: AC
  Filled 2014-08-19: qty 2

## 2014-08-19 MED ORDER — ACETAMINOPHEN 325 MG PO TABS
650.0000 mg | ORAL_TABLET | Freq: Once | ORAL | Status: AC
  Administered 2014-08-19: 650 mg via ORAL

## 2014-08-19 NOTE — ED Notes (Signed)
MD at bedside. 

## 2014-08-19 NOTE — ED Notes (Signed)
Patient transported to CT 

## 2014-08-22 ENCOUNTER — Telehealth: Payer: Self-pay | Admitting: *Deleted

## 2014-08-22 LAB — URINE CULTURE

## 2014-08-22 NOTE — Telephone Encounter (Signed)
RTC to patient.  Would like to get results of her Xrays.  Will contact Dr. Redmond Pulling to speak with patient about her results. Sander Nephew, RN 08/22/2014 1:36 PM

## 2014-08-23 ENCOUNTER — Telehealth (HOSPITAL_BASED_OUTPATIENT_CLINIC_OR_DEPARTMENT_OTHER): Payer: Self-pay | Admitting: Emergency Medicine

## 2014-08-23 NOTE — Progress Notes (Signed)
ED Antimicrobial Stewardship Positive Culture Follow Up   Rachel Murray is an 69 y.o. female who presented to [facility] on 08/18/2014 with a chief complaint of  Chief Complaint  Patient presents with  . Generalized Body Aches    Recent Results (from the past 720 hour(s))  Urine culture     Status: None   Collection Time: 08/18/14  9:25 PM  Result Value Ref Range Status   Specimen Description URINE, CLEAN CATCH  Final   Special Requests NONE  Final   Colony Count   Final    >=100,000 COLONIES/ML Performed at [facility]    Culture   Final    KLEBSIELLA PNEUMONIAE Performed at [facility]    Report Status 08/22/2014 FINAL  Final   Organism ID, Bacteria KLEBSIELLA PNEUMONIAE  Final      Susceptibility   Klebsiella pneumoniae - MIC*    AMPICILLIN RESISTANT      CEFAZOLIN <=4 SENSITIVE Sensitive     CEFTRIAXONE <=1 SENSITIVE Sensitive     CIPROFLOXACIN <=0.25 SENSITIVE Sensitive     GENTAMICIN <=1 SENSITIVE Sensitive     LEVOFLOXACIN <=0.12 SENSITIVE Sensitive     NITROFURANTOIN <=16 SENSITIVE Sensitive     TOBRAMYCIN <=1 SENSITIVE Sensitive     TRIMETH/SULFA <=20 SENSITIVE Sensitive     PIP/TAZO <=4 SENSITIVE Sensitive     * KLEBSIELLA PNEUMONIAE    [x]  Patient discharged originally without antimicrobial agent and treatment is now indicated  New antibiotic prescription: Cipro 500mg  PO BID x 7 days  ED Provider: Alvina Chou, PA-C   [name] 08/23/2014, 9:43 AM Infectious Diseases Pharmacist Phone# [phone]

## 2014-08-23 NOTE — Telephone Encounter (Signed)
Post ED Visit - Positive Culture Follow-up: Successful Patient Follow-Up  Culture assessed and recommendations reviewed by: []  Wes Dulaney, Pharm.D., BCPS []  Heide Guile, Pharm.D., BCPS []  Alycia Rossetti, Pharm.D., BCPS []  Power, Pharm.D., BCPS, AAHIVP [x]  Legrand Como, Pharm.D., BCPS, AAHIVP []  Hassie Bruce, Pharm.D. []  Milus Glazier, Pharm.D.  Positive urine culture Klebsiella   [x]  Patient discharged without antimicrobial prescription and treatment is now indicated []  Organism is resistant to prescribed ED discharge antimicrobial []  Patient with positive blood cultures  Changes discussed with ED provider: Alvina Chou PA New antibiotic prescription Ciprofloxacin 500mg  po bid x 7 days Called to [place]  Contacted patient, date 08/23/14 1224   Hazle Nordmann 08/23/2014, 12:23 PM

## 2014-08-29 ENCOUNTER — Other Ambulatory Visit: Payer: Self-pay | Admitting: *Deleted

## 2014-08-29 DIAGNOSIS — M5136 Other intervertebral disc degeneration, lumbar region: Secondary | ICD-10-CM

## 2014-08-29 MED ORDER — GABAPENTIN 300 MG PO CAPS: 300.0000 mg | ORAL_CAPSULE | Freq: Three times a day (TID) | ORAL | Status: DC

## 2014-08-29 MED ORDER — TRAMADOL HCL 50 MG PO TABS: 50.0000 mg | ORAL_TABLET | Freq: Three times a day (TID) | ORAL | Status: DC | PRN

## 2014-08-29 NOTE — Telephone Encounter (Signed)
Refills approved - nurse to call in the tramadol prescription.

## 2014-08-30 NOTE — Telephone Encounter (Signed)
Tramadol rx called to [facility]; pt called/informed.

## 2014-09-13 ENCOUNTER — Telehealth: Payer: Self-pay | Admitting: Internal Medicine

## 2014-09-13 NOTE — Telephone Encounter (Signed)
Call to patient to confirm appointment for 09/14/14 at 1:45 lmtcb.

## 2014-09-14 ENCOUNTER — Ambulatory Visit (INDEPENDENT_AMBULATORY_CARE_PROVIDER_SITE_OTHER): Payer: Medicare Other | Admitting: Internal Medicine

## 2014-09-14 ENCOUNTER — Encounter: Payer: Self-pay | Admitting: Internal Medicine

## 2014-09-14 VITALS — BP 107/59 | HR 95 | Temp 98.4°F | Ht 67.0 in | Wt 138.8 lb

## 2014-09-14 DIAGNOSIS — Z0000 Encounter for general adult medical examination without abnormal findings: Secondary | ICD-10-CM

## 2014-09-14 DIAGNOSIS — M79609 Pain in unspecified limb: Secondary | ICD-10-CM

## 2014-09-14 DIAGNOSIS — E559 Vitamin D deficiency, unspecified: Secondary | ICD-10-CM

## 2014-09-14 DIAGNOSIS — R202 Paresthesia of skin: Secondary | ICD-10-CM

## 2014-09-14 NOTE — Progress Notes (Signed)
Case discussed with Dr. Wilson soon after the resident saw the patient. We reviewed the resident's history and exam and pertinent patient test results. I agree with the assessment, diagnosis, and plan of care documented in the resident's note. 

## 2014-09-14 NOTE — Patient Instructions (Signed)
1. I am referring you for colonoscopy.  Please return to see me in 3 month for follow-up or sooner if you have a problem.   2. Please take all medications as prescribed.    3. If you have worsening of your symptoms or new symptoms arise, please call the clinic ([phone]), or go to the ER immediately if symptoms are severe.
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
## 2014-09-15 MED ORDER — VITAMIN D (ERGOCALCIFEROL) 1.25 MG (50000 UNIT) PO CAPS: 50000.0000 [IU] | ORAL_CAPSULE | ORAL | Status: DC

## 2014-09-15 NOTE — Assessment & Plan Note (Signed)
Patient still has occasional symptoms.  Pain relieved by gabapentin and Tramadol.  Labs were unremarkable.  Cervical xray  Revealed spondylosis with early left neural foraminal narrowing at C3-4. - continue gabapentin and Tramadol - avoid positions that cause discomfort - reassess at next visit

## 2014-09-15 NOTE — Assessment & Plan Note (Addendum)
She says she took the vitamin D for two weeks (once every Friday) but then misplaced the bottle.  She is requesting refill. - rx for vitamin D 50,000 IU once per week #4, no refills provided - she was instructed not to take any other supplements/multivitamin while taking high dose D

## 2014-09-15 NOTE — Assessment & Plan Note (Signed)
DEXA appt this month.  Colonoscopy scheduled for June 2016.

## 2014-09-16 ENCOUNTER — Other Ambulatory Visit: Payer: Self-pay | Admitting: Internal Medicine

## 2014-09-16 DIAGNOSIS — E559 Vitamin D deficiency, unspecified: Secondary | ICD-10-CM

## 2014-09-16 DIAGNOSIS — Z13820 Encounter for screening for osteoporosis: Secondary | ICD-10-CM

## 2014-09-16 DIAGNOSIS — Z780 Asymptomatic menopausal state: Secondary | ICD-10-CM

## 2014-09-19 ENCOUNTER — Other Ambulatory Visit: Payer: Self-pay | Admitting: Internal Medicine

## 2014-09-21 ENCOUNTER — Other Ambulatory Visit: Payer: Medicare Other

## 2014-09-30 ENCOUNTER — Other Ambulatory Visit: Payer: Self-pay | Admitting: *Deleted

## 2014-09-30 DIAGNOSIS — M5136 Other intervertebral disc degeneration, lumbar region: Secondary | ICD-10-CM

## 2014-09-30 DIAGNOSIS — M51369 Other intervertebral disc degeneration, lumbar region without mention of lumbar back pain or lower extremity pain: Secondary | ICD-10-CM

## 2014-09-30 MED ORDER — TRAMADOL HCL 50 MG PO TABS: 50.0000 mg | ORAL_TABLET | Freq: Three times a day (TID) | ORAL | Status: DC | PRN

## 2014-09-30 MED ORDER — GABAPENTIN 300 MG PO CAPS: 300.0000 mg | ORAL_CAPSULE | Freq: Three times a day (TID) | ORAL | Status: DC

## 2014-10-03 NOTE — Telephone Encounter (Signed)
Rx called in 

## 2014-10-26 ENCOUNTER — Other Ambulatory Visit: Payer: Self-pay | Admitting: *Deleted

## 2014-10-26 ENCOUNTER — Telehealth: Payer: Self-pay | Admitting: *Deleted

## 2014-10-26 DIAGNOSIS — M5136 Other intervertebral disc degeneration, lumbar region: Secondary | ICD-10-CM

## 2014-10-26 MED ORDER — TRAMADOL HCL 50 MG PO TABS: 50.0000 mg | ORAL_TABLET | Freq: Three times a day (TID) | ORAL | Status: DC | PRN

## 2014-10-26 NOTE — Telephone Encounter (Signed)
Pt calls and states she is going out of town Friday, she wants her scripts for tramadol and gabapentin filled before then, please advise

## 2014-10-26 NOTE — Telephone Encounter (Signed)
Called to pharm 

## 2014-10-26 NOTE — Telephone Encounter (Signed)
Yes, can provide 30 day rx for Tramadol since the last rx was 09/30/14.  She should have 1 month refill left on the gabapentin.

## 2014-11-16 ENCOUNTER — Encounter: Payer: Medicare Other | Admitting: Internal Medicine

## 2014-11-23 ENCOUNTER — Telehealth: Payer: Self-pay | Admitting: Internal Medicine

## 2014-11-23 ENCOUNTER — Other Ambulatory Visit: Payer: Self-pay | Admitting: *Deleted

## 2014-11-23 DIAGNOSIS — M5136 Other intervertebral disc degeneration, lumbar region: Secondary | ICD-10-CM

## 2014-11-23 NOTE — Telephone Encounter (Signed)
Patient requesting her tramadol and gabapentin to be refilled.  She can be reached @ her cell [phone]

## 2014-11-24 MED ORDER — GABAPENTIN 300 MG PO CAPS: 300.0000 mg | ORAL_CAPSULE | Freq: Three times a day (TID) | ORAL | Status: DC

## 2014-11-24 MED ORDER — TRAMADOL HCL 50 MG PO TABS: 50.0000 mg | ORAL_TABLET | Freq: Three times a day (TID) | ORAL | Status: DC | PRN

## 2014-11-24 NOTE — Telephone Encounter (Signed)
Called to pharm, pt notified by the pharm

## 2014-12-22 ENCOUNTER — Other Ambulatory Visit: Payer: Self-pay | Admitting: *Deleted

## 2014-12-22 DIAGNOSIS — M5136 Other intervertebral disc degeneration, lumbar region: Secondary | ICD-10-CM

## 2014-12-22 NOTE — Telephone Encounter (Signed)
Patient requesting refills of medications listed Below. [facility] [address] in [place]

## 2014-12-23 MED ORDER — TRAMADOL HCL 50 MG PO TABS: 50.0000 mg | ORAL_TABLET | Freq: Three times a day (TID) | ORAL | Status: DC | PRN

## 2014-12-23 NOTE — Telephone Encounter (Signed)
Tramadol rx called to [facility] in [place].

## 2014-12-26 NOTE — Telephone Encounter (Signed)
Called in by glenda p.

## 2015-01-11 ENCOUNTER — Encounter: Payer: Medicare Other | Admitting: Internal Medicine

## 2015-01-18 ENCOUNTER — Encounter: Payer: Self-pay | Admitting: Internal Medicine

## 2015-01-18 ENCOUNTER — Ambulatory Visit (INDEPENDENT_AMBULATORY_CARE_PROVIDER_SITE_OTHER): Payer: Medicare Other | Admitting: Internal Medicine

## 2015-01-18 VITALS — BP 122/62 | HR 73 | Temp 98.0°F | Ht 67.0 in | Wt 141.3 lb

## 2015-01-18 DIAGNOSIS — E559 Vitamin D deficiency, unspecified: Secondary | ICD-10-CM

## 2015-01-18 DIAGNOSIS — M5136 Other intervertebral disc degeneration, lumbar region: Secondary | ICD-10-CM | POA: Diagnosis not present

## 2015-01-18 DIAGNOSIS — Z0000 Encounter for general adult medical examination without abnormal findings: Secondary | ICD-10-CM

## 2015-01-18 DIAGNOSIS — J45991 Cough variant asthma: Secondary | ICD-10-CM

## 2015-01-18 MED ORDER — TRAMADOL HCL 50 MG PO TABS: 50.0000 mg | ORAL_TABLET | Freq: Three times a day (TID) | ORAL | Status: DC | PRN

## 2015-01-18 MED ORDER — GABAPENTIN 300 MG PO CAPS: 300.0000 mg | ORAL_CAPSULE | Freq: Three times a day (TID) | ORAL | Status: DC

## 2015-01-18 MED ORDER — ALBUTEROL SULFATE HFA 108 (90 BASE) MCG/ACT IN AERS: 2.0000 | INHALATION_SPRAY | Freq: Four times a day (QID) | RESPIRATORY_TRACT | Status: DC | PRN

## 2015-01-18 NOTE — Patient Instructions (Signed)
1. I am referring you for PT to help with your chronic back pain.  Please continue gabapentin, tramadol if needed, ibuprofen with food if needed (alternate with Tylenol), heat.   I am also referring you for colonoscopy.   2. Please take all medications as prescribed.    3. If you have worsening of your symptoms or new symptoms arise, please call the clinic [phone]), or go to the ER immediately if symptoms are severe.   Please return to see me in 3 months.

## 2015-01-18 NOTE — Assessment & Plan Note (Addendum)
Still with intermittent low back pain that responds to rest, heat, tramadol/gabapentin, ibuprofen prn.  She feels she cannot perform daily routine without tramadol and gabapentin combo.  She says she has tried each med alone but feels she needs them both.  She reports increased pain since having to clean her home for family members visiting for family weddings this summer.  It is not waking her from sleep, no saddles anesthesia, gait abnormality, loss of B&B, fever or weight loss.  She has done PT in the past but none recently. - refer for PT - continue Tramadol TID prn, refill rx provided for fill on 08/22, 09/22, 10/22 - continue TID gabapentin - continue prn ibuprofen with meals, she was advised this med is not for daily use and she confirms she does not use it every day, she will try Tylenol prn as well - continue heat, rest prn - RTC in 3 months
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
## 2015-01-18 NOTE — Assessment & Plan Note (Signed)
No dyspnea.  Rarely uses inhaler.

## 2015-01-18 NOTE — Assessment & Plan Note (Signed)
She cancelled colonoscopy.  She had a busy summer but says she will have time to go now.

## 2015-01-18 NOTE — Assessment & Plan Note (Addendum)
She finished the high dose D but did not start daily supplement yet. - rec OTC 1000 IU vitamin D3 daily

## 2015-01-19 NOTE — Progress Notes (Signed)
Case discussed with Dr. Wilson soon after the resident saw the patient.  We reviewed the resident's history and exam and pertinent patient test results.  I agree with the assessment, diagnosis and plan of care documented in the resident's note. 

## 2015-02-14 ENCOUNTER — Ambulatory Visit: Payer: Medicare Other | Attending: Internal Medicine | Admitting: Physical Therapy

## 2015-02-14 DIAGNOSIS — M5441 Lumbago with sciatica, right side: Secondary | ICD-10-CM | POA: Diagnosis not present

## 2015-02-14 DIAGNOSIS — M2560 Stiffness of unspecified joint, not elsewhere classified: Secondary | ICD-10-CM

## 2015-02-14 DIAGNOSIS — R531 Weakness: Secondary | ICD-10-CM | POA: Diagnosis not present

## 2015-02-14 DIAGNOSIS — M545 Low back pain, unspecified: Secondary | ICD-10-CM

## 2015-02-14 DIAGNOSIS — M79605 Pain in left leg: Secondary | ICD-10-CM

## 2015-02-14 DIAGNOSIS — R2681 Unsteadiness on feet: Secondary | ICD-10-CM | POA: Diagnosis not present
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
## 2015-02-16 ENCOUNTER — Ambulatory Visit: Payer: Medicare Other | Admitting: Physical Therapy

## 2015-02-22 ENCOUNTER — Other Ambulatory Visit: Payer: Self-pay | Admitting: Internal Medicine

## 2015-02-22 DIAGNOSIS — M5136 Other intervertebral disc degeneration, lumbar region: Secondary | ICD-10-CM

## 2015-02-22 NOTE — Telephone Encounter (Signed)
Pt called requesting gabapentin and tramadol to be filled.

## 2015-02-22 NOTE — Telephone Encounter (Signed)
Pt has refills on all these meds. Pt informed

## 2015-02-24 ENCOUNTER — Telehealth: Payer: Self-pay | Admitting: *Deleted

## 2015-02-24 ENCOUNTER — Other Ambulatory Visit: Payer: Self-pay | Admitting: Internal Medicine

## 2015-02-24 NOTE — Telephone Encounter (Signed)
Pt did not return to receive refill. Sent letter to pt.

## 2015-02-24 NOTE — Telephone Encounter (Addendum)
Pt came to the clinic stating she is not able to get her Tramadol Rx refilled at the pharmacy.  She said she should have refills but they labeled the bottles wrong. I looked up file and 3 Tramadol Rx's were written on 8/17 to be filled 8/22. 9/22 and 10/22.  Pt did received all 3 written Rx's. I called the pharmacy and they do not have Rx for 9/22 or 10/22. I told the pt our policy is not to refill lost Rx's.  She asked me to give her a copy of the Rx from her chart to bring to the pharmacy.  I told her they will not refill a Rx from a copy. I paged Dr Redmond Pulling but she did not answer ( out of town) Pt started yelling for me to give her a copy of the Rx's  Then said never mind I will go see " that man" and he would take care of it.  She grabbed her purse and left.  I went down to talk with the attending about the situation, she reviewed and looked up [place] data base info. She is willing to write one Rx until the PCP returns but the patient had left.  Again, this pt was yelling, rude and stormed out of my office.

## 2015-02-27 ENCOUNTER — Encounter: Payer: Self-pay | Admitting: *Deleted

## 2015-02-28 ENCOUNTER — Ambulatory Visit: Payer: Medicare Other | Admitting: Physical Therapy

## 2015-03-02 ENCOUNTER — Ambulatory Visit: Payer: Medicare Other | Admitting: Physical Therapy

## 2015-03-03 ENCOUNTER — Telehealth: Payer: Self-pay | Admitting: Physical Therapy

## 2015-03-03 NOTE — Telephone Encounter (Signed)
Left message on voicemail regarding two no shows this week. Requested return call to confirm next week's appointments on 10/4 and 10/6. Will cancel future appointments if one more no show.

## 2015-03-07 ENCOUNTER — Telehealth: Payer: Self-pay | Admitting: Physical Therapy

## 2015-03-07 ENCOUNTER — Ambulatory Visit: Payer: Medicare Other | Attending: Internal Medicine | Admitting: Physical Therapy

## 2015-03-07 NOTE — Telephone Encounter (Signed)
Third no show today. Future appointments have been canceled per our attendance policy.

## 2015-03-09 ENCOUNTER — Ambulatory Visit: Payer: Medicare Other | Admitting: Physical Therapy

## 2015-03-14 ENCOUNTER — Encounter: Payer: Medicare Other | Admitting: Physical Therapy

## 2015-03-17 ENCOUNTER — Encounter: Payer: Medicare Other | Admitting: Physical Therapy

## 2015-03-21 ENCOUNTER — Encounter: Payer: Medicare Other | Admitting: Physical Therapy

## 2015-03-23 ENCOUNTER — Encounter: Payer: Medicare Other | Admitting: Physical Therapy

## 2015-03-27 ENCOUNTER — Emergency Department (HOSPITAL_COMMUNITY)
Admission: EM | Admit: 2015-03-27 | Discharge: 2015-03-27 | Disposition: A | Discharge status: Home / Self Care | Payer: Medicare Other | Attending: Emergency Medicine | Admitting: Emergency Medicine

## 2015-03-27 ENCOUNTER — Encounter (HOSPITAL_COMMUNITY): Payer: Self-pay | Admitting: Vascular Surgery

## 2015-03-27 ENCOUNTER — Emergency Department (HOSPITAL_COMMUNITY): Payer: Medicare Other

## 2015-03-27 ENCOUNTER — Other Ambulatory Visit: Payer: Self-pay | Admitting: Internal Medicine

## 2015-03-27 DIAGNOSIS — M25551 Pain in right hip: Secondary | ICD-10-CM | POA: Insufficient documentation

## 2015-03-27 DIAGNOSIS — Z7982 Long term (current) use of aspirin: Secondary | ICD-10-CM | POA: Diagnosis not present

## 2015-03-27 DIAGNOSIS — Z8619 Personal history of other infectious and parasitic diseases: Secondary | ICD-10-CM | POA: Diagnosis not present

## 2015-03-27 DIAGNOSIS — R1030 Lower abdominal pain, unspecified: Secondary | ICD-10-CM | POA: Diagnosis not present

## 2015-03-27 DIAGNOSIS — Z79899 Other long term (current) drug therapy: Secondary | ICD-10-CM | POA: Insufficient documentation

## 2015-03-27 DIAGNOSIS — Z8541 Personal history of malignant neoplasm of cervix uteri: Secondary | ICD-10-CM | POA: Diagnosis not present

## 2015-03-27 DIAGNOSIS — J45909 Unspecified asthma, uncomplicated: Secondary | ICD-10-CM | POA: Insufficient documentation

## 2015-03-27 DIAGNOSIS — M545 Low back pain: Secondary | ICD-10-CM | POA: Diagnosis not present

## 2015-03-27 DIAGNOSIS — Z791 Long term (current) use of non-steroidal anti-inflammatories (NSAID): Secondary | ICD-10-CM | POA: Diagnosis not present

## 2015-03-27 DIAGNOSIS — M5136 Other intervertebral disc degeneration, lumbar region: Secondary | ICD-10-CM

## 2015-03-27 DIAGNOSIS — R531 Weakness: Secondary | ICD-10-CM | POA: Diagnosis not present

## 2015-03-27 DIAGNOSIS — M5440 Lumbago with sciatica, unspecified side: Secondary | ICD-10-CM | POA: Diagnosis not present

## 2015-03-27 LAB — CBC
HCT: 31.2 % — ABNORMAL LOW (ref 36.0–46.0)
Hemoglobin: 10.2 g/dL — ABNORMAL LOW (ref 12.0–15.0)
MCH: 32 pg (ref 26.0–34.0)
MCHC: 32.7 g/dL (ref 30.0–36.0)
MCV: 97.8 fL (ref 78.0–100.0)
PLATELETS: 229 10*3/uL (ref 150–400)
RBC: 3.19 MIL/uL — AB (ref 3.87–5.11)
RDW: 13 % (ref 11.5–15.5)
WBC: 5 10*3/uL (ref 4.0–10.5)

## 2015-03-27 LAB — COMPREHENSIVE METABOLIC PANEL
ALT: 12 U/L — AB (ref 14–54)
AST: 20 U/L (ref 15–41)
Albumin: 3.7 g/dL (ref 3.5–5.0)
Alkaline Phosphatase: 78 U/L (ref 38–126)
Anion gap: 7 (ref 5–15)
BUN: 11 mg/dL (ref 6–20)
CALCIUM: 9.4 mg/dL (ref 8.9–10.3)
CO2: 27 mmol/L (ref 22–32)
Chloride: 108 mmol/L (ref 101–111)
Creatinine, Ser: 1.15 mg/dL — ABNORMAL HIGH (ref 0.44–1.00)
GFR calc Af Amer: 56 mL/min — ABNORMAL LOW (ref >60)
GFR, EST NON AFRICAN AMERICAN: 48 mL/min — AB (ref >60)
Glucose, Bld: 100 mg/dL — ABNORMAL HIGH (ref 65–99)
Potassium: 3.7 mmol/L (ref 3.5–5.1)
SODIUM: 142 mmol/L (ref 135–145)
TOTAL PROTEIN: 7 g/dL (ref 6.5–8.1)
Total Bilirubin: 0.6 mg/dL (ref 0.3–1.2)

## 2015-03-27 LAB — URINALYSIS, ROUTINE W REFLEX MICROSCOPIC
BILIRUBIN URINE: NEGATIVE
Glucose, UA: NEGATIVE mg/dL
HGB URINE DIPSTICK: NEGATIVE
Ketones, ur: NEGATIVE mg/dL
Nitrite: NEGATIVE
PH: 6 (ref 5.0–8.0)
Protein, ur: NEGATIVE mg/dL
SPECIFIC GRAVITY, URINE: 1.008 (ref 1.005–1.030)
Urobilinogen, UA: 0.2 mg/dL (ref 0.0–1.0)

## 2015-03-27 LAB — URINE MICROSCOPIC-ADD ON

## 2015-03-27 MED ORDER — GABAPENTIN 300 MG PO CAPS: 300.0000 mg | ORAL_CAPSULE | Freq: Three times a day (TID) | ORAL | Status: DC

## 2015-03-27 MED ORDER — NAPROXEN 375 MG PO TABS: 375.0000 mg | ORAL_TABLET | Freq: Two times a day (BID) | ORAL | Status: DC
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
## 2015-03-27 NOTE — Telephone Encounter (Signed)
NEED REFILL FOR GAMAPENTON 300 MG, [facility] [address] [place]

## 2015-03-27 NOTE — Discharge Instructions (Signed)

## 2015-03-27 NOTE — ED Notes (Signed)
Pt reports to the ED for eval of right groin pain x 2 weeks. She reports it is intermittent. Pt also reports generalized weakness. Reports nausea but denies any V/D, swelling in the area, injury, or abd pain. Feels like it used to when she was ovulating but it wont go away. Denies any vaginal bleeding, odor, or d/c.  Pt A&Ox4, resp e/u, and skin warm and dry.

## 2015-03-27 NOTE — ED Notes (Signed)
Pt had an episode in the waiting room where she smelled a strong perfume and felt like she couldn't breath. Pt waiting room staff, pt was starting to get pale and blue in the face. Pt alert x4. NAD at this time. Pt placed on NRB at 15L for 4 minutes. Pt taken off mask and SpO2 remained at 100%.

## 2015-03-27 NOTE — ED Notes (Signed)
Pt transported to xray 

## 2015-04-25 ENCOUNTER — Other Ambulatory Visit: Payer: Self-pay | Admitting: Internal Medicine

## 2015-04-25 DIAGNOSIS — M51369 Other intervertebral disc degeneration, lumbar region without mention of lumbar back pain or lower extremity pain: Secondary | ICD-10-CM

## 2015-04-25 DIAGNOSIS — M5136 Other intervertebral disc degeneration, lumbar region: Secondary | ICD-10-CM

## 2015-04-25 MED ORDER — TRAMADOL HCL 50 MG PO TABS: 50.0000 mg | ORAL_TABLET | Freq: Three times a day (TID) | ORAL | Status: DC | PRN

## 2015-04-25 NOTE — Telephone Encounter (Signed)
Pt should have refill on gabapentin, called to inform.  Tramadol sent to PCP

## 2015-04-25 NOTE — Telephone Encounter (Signed)
Pt requesting tramadol and gabapentin filled @ [facility] on [address].

## 2015-04-25 NOTE — Telephone Encounter (Signed)
rx phoned in

## 2015-05-06 DIAGNOSIS — Z23 Encounter for immunization: Secondary | ICD-10-CM | POA: Diagnosis not present

## 2015-05-09 DIAGNOSIS — Z87891 Personal history of nicotine dependence: Secondary | ICD-10-CM | POA: Diagnosis not present

## 2015-05-09 DIAGNOSIS — Z7982 Long term (current) use of aspirin: Secondary | ICD-10-CM | POA: Diagnosis not present

## 2015-05-09 DIAGNOSIS — M5441 Lumbago with sciatica, right side: Secondary | ICD-10-CM | POA: Diagnosis not present

## 2015-05-09 DIAGNOSIS — M5431 Sciatica, right side: Secondary | ICD-10-CM | POA: Diagnosis not present

## 2015-05-17 IMAGING — CT CT RENAL STONE PROTOCOL
2 of 4 series · 16 of 46 positions shown, 18 images · non-contrast
Comparison: CT of the abdomen and pelvis performed 02/04/2014, and
pelvic ultrasound performed 07/07/2014

CLINICAL DATA: Left flank pain for 2 weeks.  Initial encounter.

EXAM:
CT ABDOMEN AND PELVIS WITHOUT CONTRAST
TECHNIQUE: Multidetector CT imaging of the abdomen and pelvis was performed
following the standard protocol without IV contrast.

[Series 2: renal stone > 200 lbs 5.0 b31f · axial · 0.79mm/px · z∈[+752,+1097]mm · 13 of 77 slices shown, 15 images]
[im 4/77  soft-tissue]
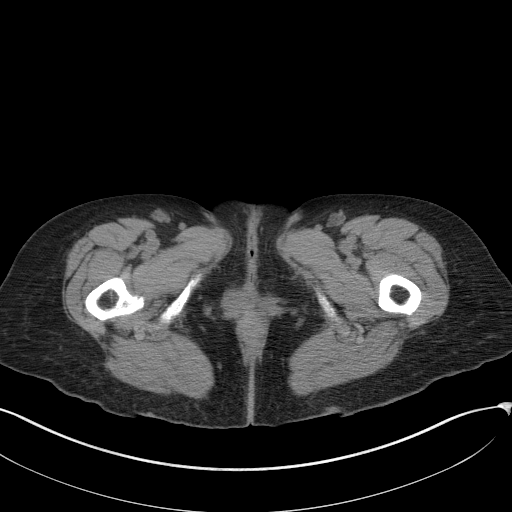
[im 4/77  bone]
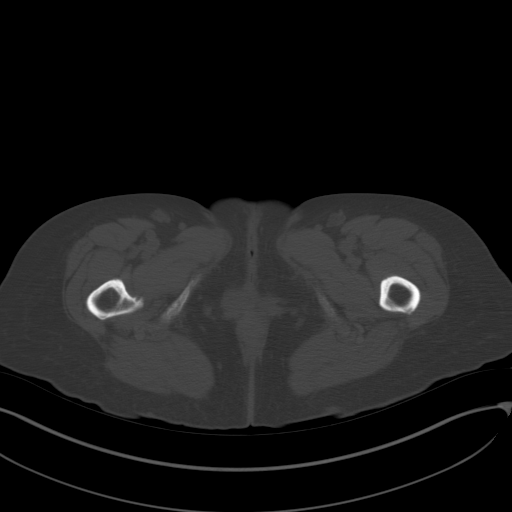
[im 10/77  soft-tissue]
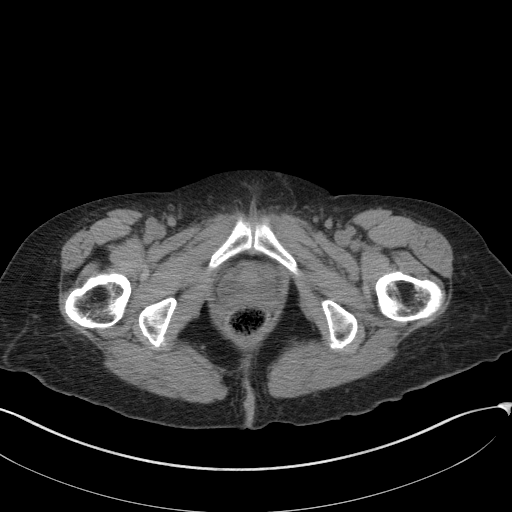
[im 17/77  soft-tissue]
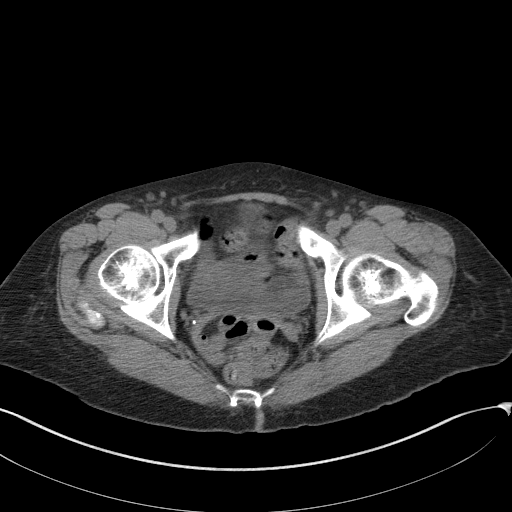
[im 20/77  soft-tissue]
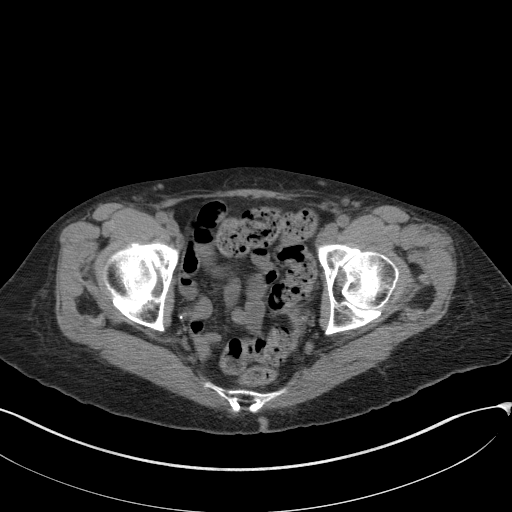
[im 27/77  soft-tissue]
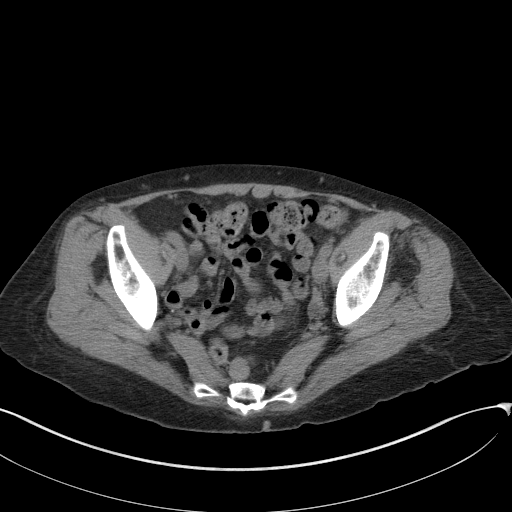
[im 34/77  soft-tissue]
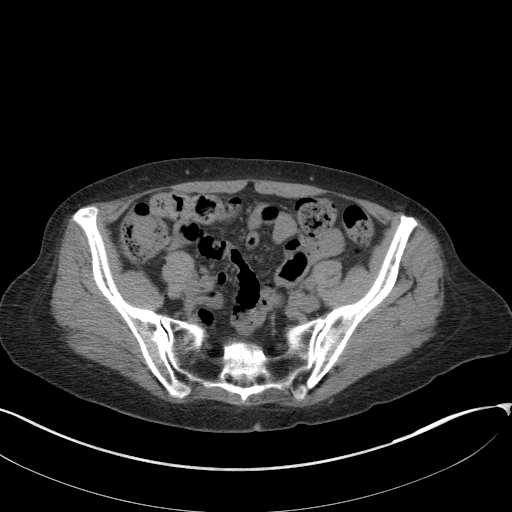
[im 40/77  soft-tissue]
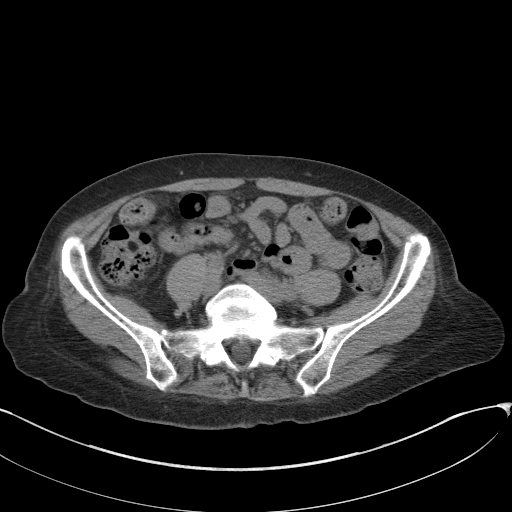
[im 43/77  soft-tissue]
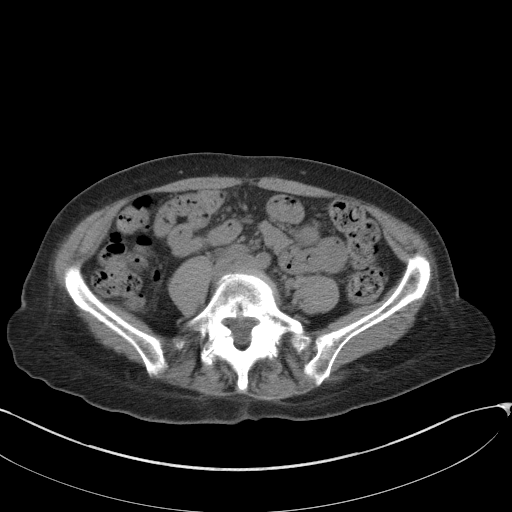
[im 50/77  soft-tissue]
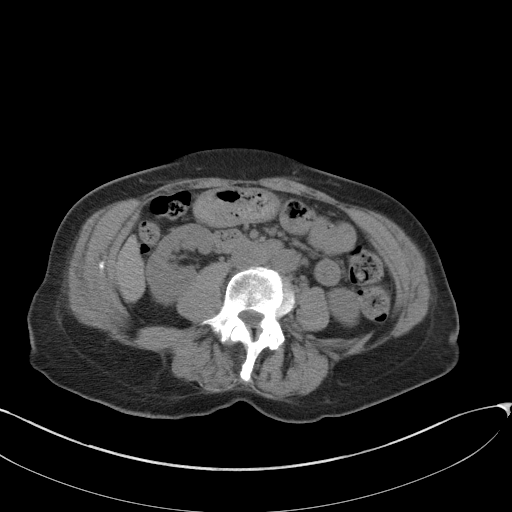
[im 50/77  bone]
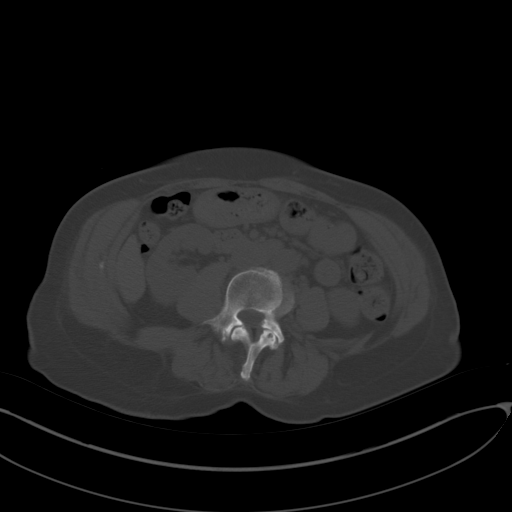
[im 57/77  soft-tissue]
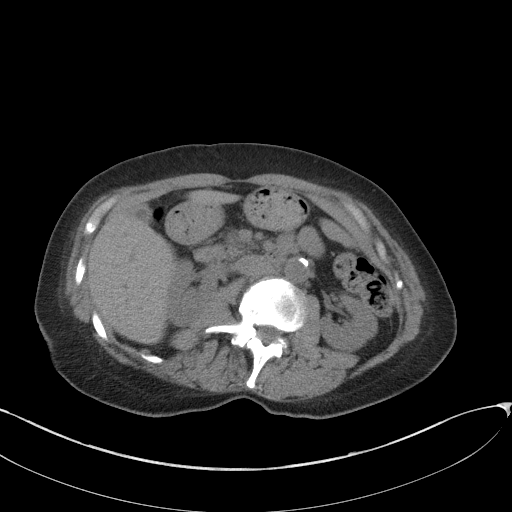
[im 60/77  soft-tissue]
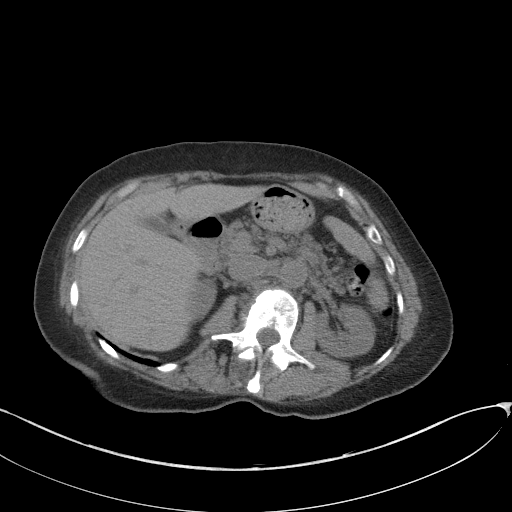
[im 67/77  soft-tissue]
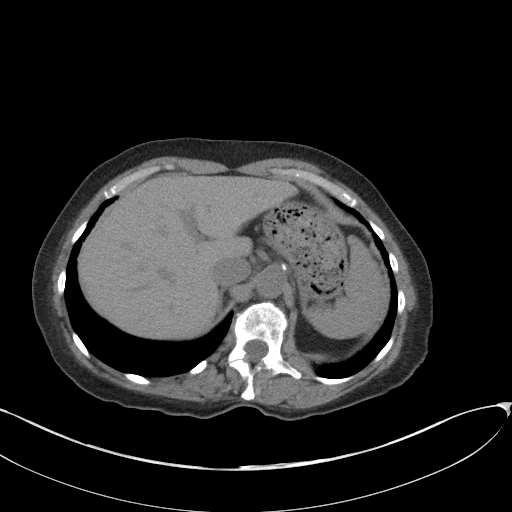
[im 73/77  soft-tissue]
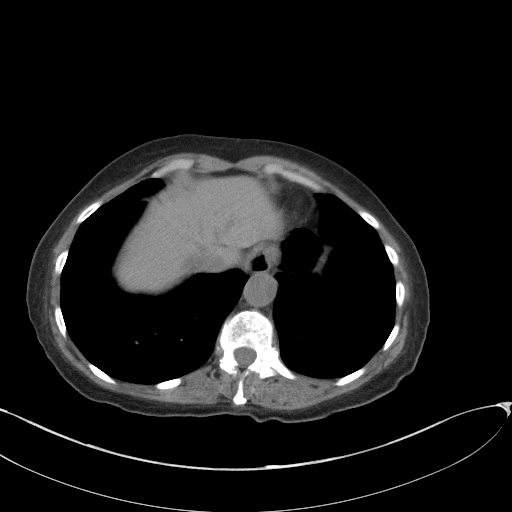

[Series 5: renal stone 3.0 coronal · coronal · 0.68mm/px · 3 of 74 slices shown]
[im 25/74  soft-tissue]
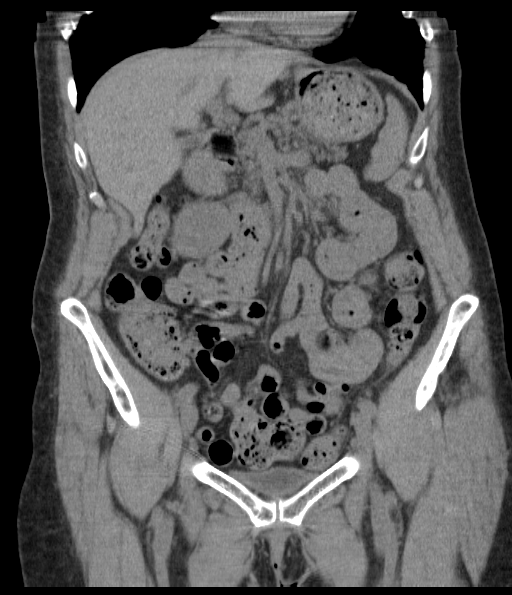
[im 33/74  soft-tissue]
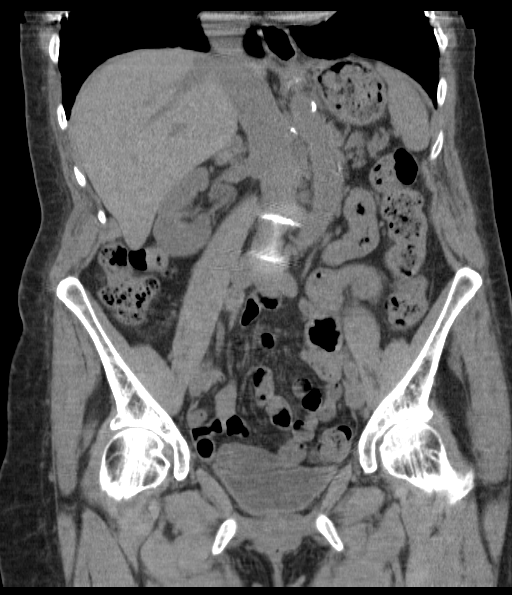
[im 41/74  soft-tissue]
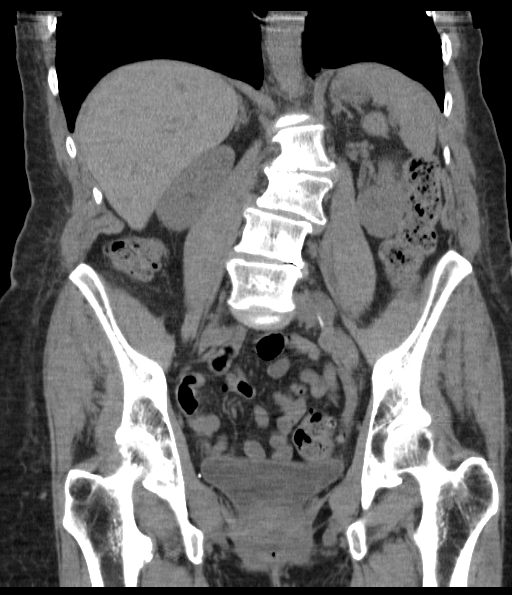

[16 of 46 positions shown; findings below may reference images not displayed]

FINDINGS: Minimal bibasilar scarring is noted.

The liver and spleen are unremarkable in appearance. The gallbladder
is within normal limits. The pancreas and adrenal glands are
unremarkable.

The kidneys are unremarkable in appearance. There is no evidence of
hydronephrosis. No renal or ureteral stones are seen. No perinephric
stranding is appreciated.

No free fluid is identified. The small bowel is unremarkable in
appearance. The stomach is within normal limits. No acute vascular
abnormalities are seen. Mild scattered calcification is seen along
the abdominal aorta and its branches.

The appendix is normal in caliber, without evidence of appendicitis.
The transverse colon is mildly redundant. The colon is grossly
unremarkable.

The bladder is mildly distended and grossly unremarkable. The
patient is status post hysterectomy. No suspicious adnexal masses
are seen. The ovaries are relatively symmetric. No inguinal
lymphadenopathy is seen.

No acute osseous abnormalities are identified. Chronic bilateral
pars defects are seen at L5, with minimal grade 1 anterolisthesis of
L5 on S1, stable from the prior study. Underlying facet disease is
noted. Disc space narrowing and endplate sclerotic change are seen
at L2-L3 on the right side.
IMPRESSION: 1. No acute abnormality seen to explain the patient's symptoms.
2. Mild scattered calcification along the abdominal aorta and its
branches.
3. Chronic bilateral pars defects at L5, with minimal grade 1
anterolisthesis of L5 on S1, stable from the prior study.

## 2015-05-24 ENCOUNTER — Encounter: Payer: Medicare Other | Admitting: Internal Medicine

## 2015-05-24 ENCOUNTER — Encounter: Payer: Self-pay | Admitting: Internal Medicine

## 2015-05-24 ENCOUNTER — Ambulatory Visit (INDEPENDENT_AMBULATORY_CARE_PROVIDER_SITE_OTHER): Payer: Medicare Other | Admitting: Internal Medicine

## 2015-05-24 VITALS — BP 120/79 | HR 94 | Temp 98.2°F | Resp 18 | Ht 67.0 in | Wt 138.2 lb

## 2015-05-24 DIAGNOSIS — M5136 Other intervertebral disc degeneration, lumbar region: Secondary | ICD-10-CM | POA: Diagnosis not present

## 2015-05-24 DIAGNOSIS — Z0000 Encounter for general adult medical examination without abnormal findings: Secondary | ICD-10-CM

## 2015-05-24 DIAGNOSIS — G5760 Lesion of plantar nerve, unspecified lower limb: Secondary | ICD-10-CM | POA: Insufficient documentation

## 2015-05-24 DIAGNOSIS — R2241 Localized swelling, mass and lump, right lower limb: Secondary | ICD-10-CM | POA: Diagnosis not present

## 2015-05-24 DIAGNOSIS — J45991 Cough variant asthma: Secondary | ICD-10-CM

## 2015-05-24 DIAGNOSIS — M79671 Pain in right foot: Secondary | ICD-10-CM

## 2015-05-24 DIAGNOSIS — E559 Vitamin D deficiency, unspecified: Secondary | ICD-10-CM

## 2015-05-24 MED ORDER — GABAPENTIN 300 MG PO CAPS: 300.0000 mg | ORAL_CAPSULE | Freq: Three times a day (TID) | ORAL | Status: DC

## 2015-05-24 MED ORDER — TRAMADOL HCL 50 MG PO TABS: 50.0000 mg | ORAL_TABLET | Freq: Three times a day (TID) | ORAL | Status: DC | PRN

## 2015-05-24 NOTE — Patient Instructions (Signed)
1. I am referring you to a specialist for your foot pain and back pain to see if injections may help.  Try buying shoe inserts or wearing sneakers with cushioning in them to take pressure off of the painful area.   2. Please take all medications as prescribed.   3. If you have worsening of your symptoms or new symptoms arise, please call the clinic [phone]), or go to the ER immediately if symptoms are severe.  Come back to see me in 6 months or sooner if you have problems. Morton Neuralgia Morton neuralgia is a type of foot pain in the area closest to your toes. This area is sometimes called the ball of your foot. Morton neuralgia occurs when a branch of a nerve in your foot (digital nerve) becomes compressed.  When this happens over a long period of time, the nerve can thicken (neuroma) and cause pain. This usually occurs between the third and fourth toe. Morton neuralgia can come and go but may get worse over time.  CAUSES Your digital nerve can become compressed and stretched at a point where it passes under a thick band of tissue that connects your toes (intermetatarsal ligament). Morton neuralgia can be caused by mild repetitive damage in this area. This type of damage can result from:   Activities such as running or jumping.  Wearing shoes that are too tight. RISK FACTORS You may be at risk for Morton neuralgia if you:  Are female.  Wear high heels.  Wear shoes that are narrow or tight.  Participate in activities that stretch your toes. These include:  Running.  Ballet.  Long-distance walking. SIGNS AND SYMPTOMS The first symptom of Morton neuralgia is pain that spreads from the ball of your foot to your toes. It may feel like you are walking on a marble. Pain usually gets worse with walking and goes away at night. Other symptoms may include numbness and cramping of your toes. DIAGNOSIS  Your health care provider will do a physical exam. When doing the exam, your health  care provider may:   Squeeze your foot just behind your toe.  Ask you to move your toes to check for pain. You may also have tests on your foot to confirm the diagnosis. These may include:   An X-ray.  An MRI. TREATMENT  Treatment for Morton neuralgia may be as simple as changing the kind of shoes you wear. Other treatments may include:  Wearing a supportive pad (orthosis) under the front of your foot. This lifts your toe bones and takes pressure off the nerve.  Getting injections of numbing medicine and anti-inflammatory medicine (steroid) in the nerve.  Having surgery to remove part of the thickened nerve. HOME CARE INSTRUCTIONS   Take medicine only as directed by your health care provider.  Wear soft-soled shoes with a wide toe area.  Stop activities that may be causing pain.  Elevate your foot when resting.  Massage your foot.  Apply ice to the injured area:   Put ice in a plastic bag.  Place a towel between your skin and the bag.  Leave the ice on for 20 minutes, 2-3 times a day.   Keep all follow-up visits as directed by your health care provider. This is important. SEEK MEDICAL CARE IF:  Home care instructions are not helping you get better.  Your symptoms change or get worse.   This information is not intended to replace advice given to you by your health care provider.  Make sure you discuss any questions you have with your health care provider.   Document Released: 08/26/2000 Document Revised: 06/10/2014 Document Reviewed: 07/21/2013 Elsevier Interactive Patient Education [employer].
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
## 2015-05-25 DIAGNOSIS — G5760 Lesion of plantar nerve, unspecified lower limb: Secondary | ICD-10-CM | POA: Insufficient documentation

## 2015-05-25 NOTE — Assessment & Plan Note (Signed)
Assessment:  The location and exam c/w Morton neuroma.  Metatarsalgia also possible.  Pain does not extend through plantar fascia or heel and not worse in the AM so unlikely plantar fasciitis.   Plan:  Will try conservative management.  She was advised to rest feet when possible and buy insert or try soft soled sneakers to off-load the foot.  Since it has been over 1 month, will also refer her to ortho to evaluate for orthotic and/or steroid injection.

## 2015-05-25 NOTE — Assessment & Plan Note (Signed)
Assessment:  She had some dyspnea when exposed to someone's perfume while waiting in the waiting room of the ED two months ago.  Otherwise stable and was discharged home.  She has had no dyspnea, wheezing or respiratory problems since then.  Rarely using albuterol. Plan:  Prn albuterol.

## 2015-05-25 NOTE — Assessment & Plan Note (Addendum)
Assessment:  She reports increased back pain with cold weather.  She sometime gets pain radiating into the legs.  She denies fever, saddles anesthesia or loss of bowel/bladder control.  She says she still needs the Tramadol to do her daily activities and pain is still responsive to Tramadol.  She has not had to limit activities due to pain.  She went to 1 PT session but no-showed the others and subsequent visits were cancelled per the no-show policy.  She was seen in [facility] ED for back pain two weeks ago and prescribed Flexeril, which helped.  She is interested in referral for possible steroid injection. Plan:  Continue Tramadol TID prn per pain contract, refill rx provided for today, after 06/24/15 and after 07/25/15.  She will continue TID gabapentin.  She will continue other conservative measures.  Will refer to ortho to evaluate for appropriateness of steroid injection.  She will return to see me in 6 months or sooner if she has problems.  Last UDS was in 2015.  Plan to check UDS next visit.  I did not get to talk with her about why she stopped PT.  Will explore this at next visit as well.

## 2015-05-25 NOTE — Assessment & Plan Note (Addendum)
She still has not gotten colonoscopy despite multiple referrals.  She says she plans to.  Will continue to encourage her to get this done. Flu shot was given at her pharmacy on 05/06/15 (results in EPIC under media tab).

## 2015-05-25 NOTE — Assessment & Plan Note (Signed)
Assessment:  She had a course of high dose D a few months ago and reports that she started taking daily D supplement. Plan:  Continue OTC vitamin D 1000 IU daily.  Will plan to check vitamin D level at next visit.

## 2015-05-28 NOTE — Progress Notes (Signed)
Case discussed with Dr. Wilson soon after the resident saw the patient. We reviewed the resident's history and exam and pertinent patient test results. I agree with the assessment, diagnosis, and plan of care documented in the resident's note. 

## 2015-06-26 ENCOUNTER — Telehealth: Payer: Self-pay | Admitting: *Deleted

## 2015-06-26 NOTE — Telephone Encounter (Signed)
CALLED PATIENT LEFT VOICE MESSAGE FOR HER TO CALL ORTHOPEDIC OFFICE TO SET UP APPOINTMENT.  OFFICE SAYS THEY HAVE LEFT MESSAGES AND SENT LETTER TO THIS PATIENT TO CONTACT THE OFFICE TO SET UP APPOINTMENT.  SPOKE WITH DAUGHTER,SHE SAYS THE OFFICE CONTACTED HER AND SHE GAVE MESSAGE TO HER MOTHER.  WHEN I SPOKE WITH OFFICE , THE PATIENT HAD NOT CONTACTED THEIR OFFICE.

## 2015-07-03 ENCOUNTER — Telehealth: Payer: Self-pay | Admitting: Internal Medicine

## 2015-07-03 DIAGNOSIS — Q675 Congenital deformity of spine: Secondary | ICD-10-CM | POA: Diagnosis not present

## 2015-07-03 DIAGNOSIS — M5136 Other intervertebral disc degeneration, lumbar region: Secondary | ICD-10-CM | POA: Diagnosis not present

## 2015-07-07 ENCOUNTER — Emergency Department (HOSPITAL_BASED_OUTPATIENT_CLINIC_OR_DEPARTMENT_OTHER)
Admission: EM | Admit: 2015-07-07 | Discharge: 2015-07-07 | Disposition: A | Discharge status: Home / Self Care | Payer: Medicare Other | Attending: Emergency Medicine | Admitting: Emergency Medicine

## 2015-07-07 ENCOUNTER — Encounter (HOSPITAL_BASED_OUTPATIENT_CLINIC_OR_DEPARTMENT_OTHER): Payer: Self-pay | Admitting: *Deleted

## 2015-07-07 DIAGNOSIS — E559 Vitamin D deficiency, unspecified: Secondary | ICD-10-CM | POA: Diagnosis not present

## 2015-07-07 DIAGNOSIS — Z79899 Other long term (current) drug therapy: Secondary | ICD-10-CM | POA: Insufficient documentation

## 2015-07-07 DIAGNOSIS — Z87828 Personal history of other (healed) physical injury and trauma: Secondary | ICD-10-CM | POA: Insufficient documentation

## 2015-07-07 DIAGNOSIS — Z90710 Acquired absence of both cervix and uterus: Secondary | ICD-10-CM | POA: Diagnosis not present

## 2015-07-07 DIAGNOSIS — N390 Urinary tract infection, site not specified: Secondary | ICD-10-CM

## 2015-07-07 DIAGNOSIS — Z7982 Long term (current) use of aspirin: Secondary | ICD-10-CM | POA: Insufficient documentation

## 2015-07-07 DIAGNOSIS — Z862 Personal history of diseases of the blood and blood-forming organs and certain disorders involving the immune mechanism: Secondary | ICD-10-CM | POA: Diagnosis not present

## 2015-07-07 DIAGNOSIS — Z8541 Personal history of malignant neoplasm of cervix uteri: Secondary | ICD-10-CM | POA: Insufficient documentation

## 2015-07-07 DIAGNOSIS — J45909 Unspecified asthma, uncomplicated: Secondary | ICD-10-CM | POA: Insufficient documentation

## 2015-07-07 DIAGNOSIS — Z8739 Personal history of other diseases of the musculoskeletal system and connective tissue: Secondary | ICD-10-CM | POA: Insufficient documentation

## 2015-07-07 DIAGNOSIS — Z8619 Personal history of other infectious and parasitic diseases: Secondary | ICD-10-CM | POA: Diagnosis not present

## 2015-07-07 DIAGNOSIS — R1030 Lower abdominal pain, unspecified: Secondary | ICD-10-CM | POA: Diagnosis present

## 2015-07-07 LAB — URINE MICROSCOPIC-ADD ON

## 2015-07-07 LAB — URINALYSIS, ROUTINE W REFLEX MICROSCOPIC
Bilirubin Urine: NEGATIVE
GLUCOSE, UA: NEGATIVE mg/dL
Ketones, ur: NEGATIVE mg/dL
Nitrite: POSITIVE — AB
PROTEIN: 30 mg/dL — AB
Specific Gravity, Urine: 1.017 (ref 1.005–1.030)
pH: 6 (ref 5.0–8.0)

## 2015-07-07 MED ORDER — CEPHALEXIN 500 MG PO CAPS: 500.0000 mg | ORAL_CAPSULE | Freq: Two times a day (BID) | ORAL | Status: DC

## 2015-07-07 MED ORDER — CEPHALEXIN 250 MG PO CAPS
500.0000 mg | ORAL_CAPSULE | Freq: Once | ORAL | Status: AC
  Administered 2015-07-07: 500 mg via ORAL
  Filled 2015-07-07: qty 2
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
## 2015-07-07 NOTE — ED Notes (Signed)
Pt c/o bil flank pain x 3 days with painful urination x 3 days

## 2015-07-07 NOTE — Discharge Instructions (Signed)

## 2015-07-10 LAB — URINE CULTURE

## 2015-07-12 ENCOUNTER — Telehealth: Payer: Self-pay | Admitting: *Deleted

## 2015-07-12 NOTE — ED Notes (Signed)
(+)  urine culture, treated with Cephalexin, OK per Elenor Quinones, Pharm

## 2015-07-21 ENCOUNTER — Encounter: Payer: Self-pay | Admitting: Internal Medicine

## 2015-07-21 DIAGNOSIS — M419 Scoliosis, unspecified: Secondary | ICD-10-CM | POA: Insufficient documentation

## 2015-07-27 ENCOUNTER — Other Ambulatory Visit: Payer: Self-pay | Admitting: Internal Medicine

## 2015-07-27 DIAGNOSIS — M5136 Other intervertebral disc degeneration, lumbar region: Secondary | ICD-10-CM

## 2015-07-27 NOTE — Telephone Encounter (Signed)
Pt requesting gabapentin and tramadol to be filled @ [facility].

## 2015-07-28 ENCOUNTER — Telehealth: Payer: Self-pay | Admitting: Internal Medicine

## 2015-07-28 ENCOUNTER — Other Ambulatory Visit: Payer: Self-pay | Admitting: Internal Medicine
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
## 2015-07-28 NOTE — Telephone Encounter (Signed)
Called [facility] Tramadol filled 12/26, 1/27- should have 1 more script in her hands, pharmacy states they do not have one on hold Gabapentin filled 12/21 for #270- a 3 month supply she has 1 more on file and it will be due in appr 27 days per pharmacy [place]. Is giving pt the information

## 2015-08-30 ENCOUNTER — Other Ambulatory Visit: Payer: Self-pay | Admitting: Internal Medicine

## 2015-08-30 DIAGNOSIS — M5136 Other intervertebral disc degeneration, lumbar region: Secondary | ICD-10-CM

## 2015-08-30 NOTE — Telephone Encounter (Signed)
Requesting gabapentin and tramadol to be filled @ [facility] in [place].

## 2015-08-31 MED ORDER — TRAMADOL HCL 50 MG PO TABS: 50.0000 mg | ORAL_TABLET | Freq: Three times a day (TID) | ORAL | Status: DC | PRN

## 2015-08-31 MED ORDER — GABAPENTIN 300 MG PO CAPS: 300.0000 mg | ORAL_CAPSULE | Freq: Three times a day (TID) | ORAL | Status: DC

## 2015-09-01 NOTE — Telephone Encounter (Signed)
Called into [facility] [place]

## 2015-09-28 ENCOUNTER — Other Ambulatory Visit: Payer: Self-pay

## 2015-09-28 DIAGNOSIS — M5136 Other intervertebral disc degeneration, lumbar region: Secondary | ICD-10-CM

## 2015-09-28 MED ORDER — TRAMADOL HCL 50 MG PO TABS: 50.0000 mg | ORAL_TABLET | Freq: Three times a day (TID) | ORAL | Status: DC | PRN

## 2015-09-28 NOTE — Telephone Encounter (Signed)
Pt requesting tramadol to be filled @ [facility].

## 2015-09-28 NOTE — Telephone Encounter (Signed)
Last filled by you 3/30 Next appt not scheduled but will need to be scheduled for June Last uds 2015

## 2015-09-29 NOTE — Telephone Encounter (Signed)
Called script in, tried to call pt to schedule appt, her daughter answered and said she would have her call back

## 2015-09-29 NOTE — Telephone Encounter (Signed)
Call from pt-meds called into wrong pharmacy.  Per pt, we should be using [facility] in [place],.  Phoned into pharmacy, phone call complete.Despina Hidden Cassady4/28/20173:58 PM

## 2015-10-26 ENCOUNTER — Ambulatory Visit: Payer: Medicare Other | Admitting: Internal Medicine

## 2015-10-27 ENCOUNTER — Ambulatory Visit (INDEPENDENT_AMBULATORY_CARE_PROVIDER_SITE_OTHER): Payer: Medicare Other | Admitting: Internal Medicine

## 2015-10-27 ENCOUNTER — Encounter: Payer: Self-pay | Admitting: Internal Medicine

## 2015-10-27 VITALS — BP 118/71 | HR 89 | Temp 99.0°F | Ht 67.0 in | Wt 140.6 lb

## 2015-10-27 DIAGNOSIS — E2839 Other primary ovarian failure: Secondary | ICD-10-CM | POA: Insufficient documentation

## 2015-10-27 DIAGNOSIS — J45991 Cough variant asthma: Secondary | ICD-10-CM

## 2015-10-27 DIAGNOSIS — M5136 Other intervertebral disc degeneration, lumbar region: Secondary | ICD-10-CM | POA: Diagnosis not present

## 2015-10-27 DIAGNOSIS — Z7951 Long term (current) use of inhaled steroids: Secondary | ICD-10-CM | POA: Diagnosis not present

## 2015-10-27 DIAGNOSIS — E559 Vitamin D deficiency, unspecified: Secondary | ICD-10-CM

## 2015-10-27 DIAGNOSIS — Z1231 Encounter for screening mammogram for malignant neoplasm of breast: Secondary | ICD-10-CM | POA: Insufficient documentation

## 2015-10-27 DIAGNOSIS — Z0000 Encounter for general adult medical examination without abnormal findings: Secondary | ICD-10-CM

## 2015-10-27 MED ORDER — TRAMADOL HCL 50 MG PO TABS: 50.0000 mg | ORAL_TABLET | Freq: Three times a day (TID) | ORAL | Status: DC | PRN

## 2015-10-27 MED ORDER — ALBUTEROL SULFATE HFA 108 (90 BASE) MCG/ACT IN AERS: 2.0000 | INHALATION_SPRAY | Freq: Four times a day (QID) | RESPIRATORY_TRACT | Status: DC | PRN

## 2015-10-27 NOTE — Patient Instructions (Signed)
1. Please follow up in 6 weeks.   I have placed a referral for mammogram and bone density test.   2. Please take all medications as previously prescribed.  3. If you have worsening of your symptoms or new symptoms arise, please call the clinic [phone]), or go to the ER immediately if symptoms are severe.  You have done a great job in taking all your medications. Please continue to do this.
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
## 2015-10-28 LAB — VITAMIN D 25 HYDROXY (VIT D DEFICIENCY, FRACTURES): Vit D, 25-Hydroxy: 22.6 ng/mL — ABNORMAL LOW (ref 30.0–100.0)

## 2015-10-31 NOTE — Assessment & Plan Note (Signed)
Mammo, bone density

## 2015-10-31 NOTE — Assessment & Plan Note (Signed)
Referred patient for mammo

## 2015-10-31 NOTE — Assessment & Plan Note (Signed)
Patient with Vit D deficiency and states she has some height loss over the past few years. Has not been told she has osteopenia/osteoporosis in the past, has not had bone density. -Refer for bone density

## 2015-10-31 NOTE — Assessment & Plan Note (Signed)
Recheck Vit D today. Continue supplementation. Send for bone density

## 2015-10-31 NOTE — Assessment & Plan Note (Signed)
Patient with chronic back pain, takes Tramadol per pain contract. Says she took one this AM.  -Check UDS today -Refill Tramadol 50 mg tid prn #90 -Continue Neurontin for radiculopathy symptoms

## 2015-10-31 NOTE — Assessment & Plan Note (Signed)
Refilled albuterol 

## 2015-11-02 NOTE — Progress Notes (Signed)
Internal Medicine Clinic Attending  Case discussed with Dr. Jones at the time of the visit.  We reviewed the resident's history and exam and pertinent patient test results.  I agree with the assessment, diagnosis, and plan of care documented in the resident's note.  

## 2015-11-05 LAB — TOXASSURE SELECT,+ANTIDEPR,UR

## 2015-11-15 ENCOUNTER — Encounter: Payer: Self-pay | Admitting: *Deleted

## 2015-11-28 ENCOUNTER — Other Ambulatory Visit: Payer: Self-pay | Admitting: Internal Medicine

## 2015-11-28 DIAGNOSIS — M5136 Other intervertebral disc degeneration, lumbar region: Secondary | ICD-10-CM

## 2015-11-28 MED ORDER — GABAPENTIN 300 MG PO CAPS: 300.0000 mg | ORAL_CAPSULE | Freq: Three times a day (TID) | ORAL | Status: DC

## 2015-11-28 MED ORDER — TRAMADOL HCL 50 MG PO TABS: 50.0000 mg | ORAL_TABLET | Freq: Three times a day (TID) | ORAL | Status: DC | PRN

## 2015-11-28 NOTE — Telephone Encounter (Signed)
Rachel Murray - She has a 6 week F/U appt in July with [facility]. I cannot find reason for such close F/U. If she is agreeable, would you pls cancel that appt and get her in with new PCP end of aug / sep. I am giving her RF to get her to sep. Pls phone in tramadol  Database OK, [place].

## 2015-11-28 NOTE — Telephone Encounter (Signed)
traMADol (ULTRAM) 50 MG tablet gabapentin (NEURONTIN) 300 MG capsule [facility] [place]

## 2015-11-29 NOTE — Telephone Encounter (Signed)
Called to pharm 

## 2015-12-06 ENCOUNTER — Encounter: Payer: Self-pay | Admitting: Internal Medicine

## 2015-12-06 ENCOUNTER — Ambulatory Visit: Payer: Medicare Other

## 2015-12-28 ENCOUNTER — Other Ambulatory Visit: Payer: Self-pay | Admitting: Internal Medicine

## 2015-12-28 NOTE — Telephone Encounter (Signed)
[facility] refill [facility]

## 2015-12-28 NOTE — Telephone Encounter (Signed)
Has refills at pharm 

## 2016-01-31 ENCOUNTER — Encounter: Payer: Self-pay | Admitting: Internal Medicine

## 2016-01-31 ENCOUNTER — Ambulatory Visit (INDEPENDENT_AMBULATORY_CARE_PROVIDER_SITE_OTHER): Payer: Medicare Other | Admitting: Internal Medicine

## 2016-01-31 ENCOUNTER — Ambulatory Visit (HOSPITAL_COMMUNITY)
Admission: RE | Admit: 2016-01-31 | Discharge: 2016-01-31 | Disposition: A | Discharge status: Home / Self Care | Payer: Medicare Other | Source: Ambulatory Visit | Attending: Internal Medicine | Admitting: Internal Medicine

## 2016-01-31 VITALS — BP 113/72 | HR 81 | Temp 98.2°F | Ht 67.0 in | Wt 139.9 lb

## 2016-01-31 DIAGNOSIS — M79604 Pain in right leg: Secondary | ICD-10-CM

## 2016-01-31 DIAGNOSIS — R1012 Left upper quadrant pain: Secondary | ICD-10-CM | POA: Diagnosis not present

## 2016-01-31 DIAGNOSIS — R109 Unspecified abdominal pain: Secondary | ICD-10-CM | POA: Insufficient documentation

## 2016-01-31 DIAGNOSIS — M549 Dorsalgia, unspecified: Secondary | ICD-10-CM | POA: Diagnosis not present

## 2016-01-31 MED ORDER — NAPROXEN 500 MG PO TABS: 500.0000 mg | ORAL_TABLET | Freq: Two times a day (BID) | ORAL | 0 refills | Status: AC
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
## 2016-01-31 NOTE — Patient Instructions (Signed)
FOR YOUR RIGHT LEG PAIN: -We will check an ultrasound of your leg to rule out any blood clots or a Baker's Cyst. If the ultrasound is normal, your pain is likely a nerve pain radiating from the back.  FOR YOUR LEFT BACK PAIN: -We will check an xray and urine studies to determine if you have a kidney stone or infection. If these are normal, then your pain is likely due to a muscle strain or from your chronic back pain due to scoliosis. -Continue taking your tramadol, tylenol and take Naproxen 500 mg twice a day with meals for 7 days for your pain

## 2016-01-31 NOTE — Assessment & Plan Note (Signed)
Patient presents with acute on chronic left flank pain that seems different from her normal back pain. She states the pain is constant and radiates up and down her left flank. It does not radiate to her groin. She denies dysuria, hematuria or increased urinary frequency. She does admit to a history of kidney stones. She states the pain is a 10/10 and improves to a 4/10 after tramadol and tylenol. She denies fever, chills or abdominal pain. Physical exam shows no CVA tenderness, but does show levoscoliosis with tense musculature on left.  Assessment: Likely muscle spasm 2/2 chronic back pain from levoscoliosis. However, we will rule out Nephrolithiasis with UA for blood and KUB  Plan: -KUB -UA -Continue tramadol, tylenol.  -Added heat to area and naproxen 500 mg BID WC x 7 days

## 2016-01-31 NOTE — Assessment & Plan Note (Signed)
Patient also complains of right lower extremity pain over the past 3 weeks. Pain originally started in her thigh and now localizes behind her knee, but can radiate back up her leg. The pain is constant, and a "pressure-like, pulling sensation." She is worried that she has a blood clot. She denies personal or family history of blood clots, recent injuries or falls. She states her right leg feels weaker than her left. Ambulating makes the pain worse. Rest and Tramadol makes the pain better. No obvious findings on exam concerning for DVT or Baker's cyst.   Assessment: Likely secondary to sciatica, but will rule out DVT and Baker's cyst  Plan: -right lower extremity ultrasound -Continue tramadol, tylenol and naproxen for pain

## 2016-02-01 LAB — URINALYSIS, ROUTINE W REFLEX MICROSCOPIC
Bilirubin, UA: NEGATIVE
Glucose, UA: NEGATIVE
KETONES UA: NEGATIVE
Nitrite, UA: NEGATIVE
PROTEIN UA: NEGATIVE
RBC, UA: NEGATIVE
SPEC GRAV UA: 1.023 (ref 1.005–1.030)
Urobilinogen, Ur: 0.2 mg/dL (ref 0.2–1.0)
pH, UA: 6 (ref 5.0–7.5)

## 2016-02-01 LAB — MICROSCOPIC EXAMINATION: Casts: NONE SEEN /lpf

## 2016-02-02 ENCOUNTER — Ambulatory Visit (HOSPITAL_COMMUNITY)
Admission: RE | Admit: 2016-02-02 | Discharge: 2016-02-02 | Disposition: A | Discharge status: Home / Self Care | Payer: Medicare Other | Source: Ambulatory Visit | Attending: Internal Medicine | Admitting: Internal Medicine

## 2016-02-02 DIAGNOSIS — M79604 Pain in right leg: Secondary | ICD-10-CM | POA: Insufficient documentation

## 2016-02-02 NOTE — Progress Notes (Signed)
Internal Medicine Clinic Attending  Case discussed with Dr. Burns soon after the resident saw the patient.  We reviewed the resident's history and exam and pertinent patient test results.  I agree with the assessment, diagnosis, and plan of care documented in the resident's note. 

## 2016-02-02 NOTE — Progress Notes (Addendum)
**  Preliminary report by tech**  Bilateral lower extremity venous duplex completed. There is no evidence of deep or superficial vein thrombosis involving the right and left lower extremities. All visualized vessels appear patent and compressible. There is no evidence of Baker's cysts bilaterally. Attempted to call results to the number provided in the patient's chart. There was no answer and no option to leave a message.  02/02/16 9:06 AM Rachel Murray RVT

## 2016-02-06 ENCOUNTER — Telehealth: Payer: Self-pay | Admitting: *Deleted

## 2016-02-06 NOTE — Telephone Encounter (Signed)
Pt called - spoke to her daughter,Beverly, informed " lower extremity ultrasound did not show any blood clots" per Dr Quay Burow. Asked about urine and blood work - informed her no labs were done. Will let her if urine is abnl.

## 2016-02-06 NOTE — Telephone Encounter (Signed)
Thank you Glenda. I had previously discussed the normal UA and normal Abdominal xray with patient.   Rachel Murray

## 2016-02-28 ENCOUNTER — Telehealth: Payer: Self-pay | Admitting: Internal Medicine

## 2016-02-28 DIAGNOSIS — M5136 Other intervertebral disc degeneration, lumbar region: Secondary | ICD-10-CM

## 2016-02-28 NOTE — Telephone Encounter (Signed)
traMADol (ULTRAM) 50 MG tablet [facility]

## 2016-02-28 NOTE — Telephone Encounter (Signed)
Last office visit: 01/31/2016 in [facility] Last UDS: 10/27/2015 Last Refill: 01/29/2016 #90 Next appt: none sheduled  Message sent to front office for next avail with pcp.Regenia Skeeter, Nour Scalise Cassady9/27/201710:29 AM

## 2016-02-29 ENCOUNTER — Telehealth: Payer: Self-pay | Admitting: *Deleted

## 2016-02-29 MED ORDER — TRAMADOL HCL 50 MG PO TABS: 50.0000 mg | ORAL_TABLET | Freq: Three times a day (TID) | ORAL | 2 refills | Status: DC | PRN

## 2016-02-29 NOTE — Telephone Encounter (Signed)
Call[facility] in , pharmacist called back and stated after calling insurance company that pt got an old script for tramadol filled 9/27 am in [place] [facility] 9/27 am pt called clinic 9/27 am and ask for a refill of tramadol. Pharmacist was asked to put a note in pt profile stating do not fill until 30 days after last refill.

## 2016-02-29 NOTE — Telephone Encounter (Signed)
Called to pharm 

## 2016-03-06 NOTE — Telephone Encounter (Signed)
PCP first available is not until Nov.  Pt scheduled for 05-02-16 @ 1:45 pm.  Appt card has been mailed.

## 2016-03-29 ENCOUNTER — Other Ambulatory Visit: Payer: Self-pay

## 2016-03-29 NOTE — Telephone Encounter (Signed)
Called pt - her daughter answered; pt not available. Left message she has refills; to call the pharmacy.

## 2016-03-29 NOTE — Telephone Encounter (Signed)
traMADol (ULTRAM) 50 MG tablet, refill request.  ° °

## 2016-05-02 ENCOUNTER — Encounter: Payer: Medicare Other | Admitting: Internal Medicine

## 2016-05-29 ENCOUNTER — Telehealth: Payer: Self-pay | Admitting: Internal Medicine

## 2016-05-29 DIAGNOSIS — M5136 Other intervertebral disc degeneration, lumbar region: Secondary | ICD-10-CM

## 2016-05-29 MED ORDER — GABAPENTIN 300 MG PO CAPS: 300.0000 mg | ORAL_CAPSULE | Freq: Three times a day (TID) | ORAL | 0 refills | Status: DC

## 2016-05-29 NOTE — Telephone Encounter (Signed)
   Reason for call:   I received a call from Ms. Dwan Bolt Casciano's son at 4:33 PM requesting a refill on her gabapentin.    Pertinent Data:   Patient has a history of degenerative disc disease and chronic back pain. She takes gabapentin 300 mg TID for radiculopathy symptoms.   Patient is currently out of state in [place], [place] and has run out her medication.   Spoke directly to the patient over the phone. She stated this medication helps with her pain and requested a refill to be sent to a [facility] in [place].    Assessment / Plan / Recommendations:   Refill Gabapentin 300 mg TID, 90 day supply   As always, pt is advised that if symptoms worsen or new symptoms arise, they should go to an urgent care facility or to to ER for further evaluation.   Shela Leff, MD   05/29/2016, 4:42 PM

## 2016-06-13 ENCOUNTER — Encounter: Payer: Self-pay | Admitting: Internal Medicine

## 2016-06-13 DIAGNOSIS — Z79891 Long term (current) use of opiate analgesic: Secondary | ICD-10-CM | POA: Insufficient documentation

## 2016-06-24 DIAGNOSIS — Z23 Encounter for immunization: Secondary | ICD-10-CM | POA: Diagnosis not present

## 2016-06-24 DIAGNOSIS — Z8619 Personal history of other infectious and parasitic diseases: Secondary | ICD-10-CM | POA: Diagnosis not present

## 2016-06-24 DIAGNOSIS — Z1159 Encounter for screening for other viral diseases: Secondary | ICD-10-CM | POA: Diagnosis not present

## 2016-06-24 DIAGNOSIS — N390 Urinary tract infection, site not specified: Secondary | ICD-10-CM | POA: Diagnosis not present

## 2016-06-24 DIAGNOSIS — M810 Age-related osteoporosis without current pathological fracture: Secondary | ICD-10-CM | POA: Diagnosis not present

## 2016-06-24 DIAGNOSIS — Z6823 Body mass index (BMI) 23.0-23.9, adult: Secondary | ICD-10-CM | POA: Diagnosis not present

## 2016-06-24 DIAGNOSIS — Z13220 Encounter for screening for lipoid disorders: Secondary | ICD-10-CM | POA: Diagnosis not present

## 2016-06-24 DIAGNOSIS — M797 Fibromyalgia: Secondary | ICD-10-CM | POA: Diagnosis not present

## 2016-06-27 ENCOUNTER — Other Ambulatory Visit: Payer: Self-pay | Admitting: Internal Medicine

## 2016-06-27 ENCOUNTER — Telehealth: Payer: Self-pay | Admitting: Internal Medicine

## 2016-06-27 DIAGNOSIS — M5136 Other intervertebral disc degeneration, lumbar region: Secondary | ICD-10-CM

## 2016-06-27 MED ORDER — TRAMADOL HCL 50 MG PO TABS: 50.0000 mg | ORAL_TABLET | Freq: Three times a day (TID) | ORAL | 0 refills | Status: DC | PRN

## 2016-06-27 NOTE — Telephone Encounter (Signed)
Will refill one month, paper Rx given to triage. Pt needs appt for further refills.

## 2016-06-27 NOTE — Telephone Encounter (Signed)
Patient requesting a refill     traMADol (ULTRAM) 50 MG tablet

## 2016-07-22 ENCOUNTER — Encounter: Payer: Self-pay | Admitting: *Deleted

## 2016-07-25 ENCOUNTER — Other Ambulatory Visit: Payer: Self-pay

## 2016-07-25 DIAGNOSIS — M5136 Other intervertebral disc degeneration, lumbar region: Secondary | ICD-10-CM

## 2016-07-25 NOTE — Telephone Encounter (Signed)
traMADol (ULTRAM) 50 MG tablet, Refill [facility] @ [facility].

## 2016-07-25 NOTE — Telephone Encounter (Signed)
Requesting tramadol to be [facility] in [place].

## 2016-07-25 NOTE — Telephone Encounter (Signed)
Id pt's # to ask if pharm was correct, last month she told me she had moved to [place], expecting rtc

## 2016-07-26 NOTE — Telephone Encounter (Signed)
Calling back for meds. Please call pt back @ [phone].

## 2016-07-26 NOTE — Telephone Encounter (Signed)
Gave paper Rx to Triage on 06/27/16 for one month supply. Pt will need appt prior to further refills.

## 2016-07-29 MED ORDER — TRAMADOL HCL 50 MG PO TABS: 50.0000 mg | ORAL_TABLET | Freq: Three times a day (TID) | ORAL | 0 refills | Status: DC | PRN

## 2016-07-29 NOTE — Telephone Encounter (Signed)
It appears she has been on tramadol for quite some time and has had appropriate UDS.  However she missed her appointment with her PCP back in November, her PCP did graciously refill her medication last month with instructions that she would need to be seen within the month.    It will need to be stressed to the patient that she will need to be seen reguarly at LEAST every 3 months by her PCP to be on this medication and that refills outside of the office visit are a VIOLATION of her pain contract.  I will allow her to have a limited prescription to get her to the appointment on Monday with 21 pills of Tramadol (7 day course).  Daisy please call this in.

## 2016-07-29 NOTE — Telephone Encounter (Signed)
Patient is out of tramadol, has pcp appt 09/02/16 but can come for appt next Monday. Would like a refill before then. Will set up with front desk. Thanks!

## 2016-07-29 NOTE — Telephone Encounter (Signed)
Tramadol called in to [facility] in [place] per patient request.  Reminded patient it's for 7 day supply & needs to keep her appt for 08/05/16. Patient agreed. Patient stated she still keeps her place here in [place] & goes back & forth to [place] to live with children that can care for her. Has a dtr that works here @ hospital who can provide transportation.

## 2016-07-29 NOTE — Telephone Encounter (Signed)
Pt asking for nurse to call back

## 2016-08-05 ENCOUNTER — Other Ambulatory Visit: Payer: Self-pay | Admitting: Internal Medicine

## 2016-08-05 ENCOUNTER — Ambulatory Visit (INDEPENDENT_AMBULATORY_CARE_PROVIDER_SITE_OTHER): Payer: Medicare Other | Admitting: Internal Medicine

## 2016-08-05 VITALS — BP 110/84 | HR 93 | Temp 98.9°F | Ht 67.0 in | Wt 150.9 lb

## 2016-08-05 DIAGNOSIS — Z79891 Long term (current) use of opiate analgesic: Secondary | ICD-10-CM | POA: Diagnosis not present

## 2016-08-05 DIAGNOSIS — M5136 Other intervertebral disc degeneration, lumbar region: Secondary | ICD-10-CM

## 2016-08-05 DIAGNOSIS — M25561 Pain in right knee: Secondary | ICD-10-CM | POA: Diagnosis not present

## 2016-08-05 DIAGNOSIS — Z1231 Encounter for screening mammogram for malignant neoplasm of breast: Secondary | ICD-10-CM

## 2016-08-05 DIAGNOSIS — M4186 Other forms of scoliosis, lumbar region: Secondary | ICD-10-CM | POA: Diagnosis not present

## 2016-08-05 DIAGNOSIS — R109 Unspecified abdominal pain: Secondary | ICD-10-CM | POA: Diagnosis not present

## 2016-08-05 DIAGNOSIS — Z0000 Encounter for general adult medical examination without abnormal findings: Secondary | ICD-10-CM | POA: Insufficient documentation

## 2016-08-05 MED ORDER — TRAMADOL HCL 50 MG PO TABS: 50.0000 mg | ORAL_TABLET | Freq: Three times a day (TID) | ORAL | 0 refills | Status: DC | PRN

## 2016-08-05 MED ORDER — GABAPENTIN 300 MG PO CAPS: 300.0000 mg | ORAL_CAPSULE | Freq: Three times a day (TID) | ORAL | 0 refills | Status: DC

## 2016-08-05 NOTE — Patient Instructions (Addendum)
It was a pleasure to meet you.  Gave you refill of your tramadol and gabapentin.  Ordered mammogram.  Please see your doctor Dr. Gay Filler whenever he has opening next.
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
## 2016-08-05 NOTE — Progress Notes (Signed)
Patient ID: ARYN MOLE, female   DOB: Jul 17, 1945, 71 y.o.   MRN: [id number]  Case discussed with Dr. Genene Churn at the time of the visit. We reviewed the resident's history and exam and pertinent patient test results. I agree with the assessment, diagnosis, and plan of care documented in the resident's note.

## 2016-08-05 NOTE — Assessment & Plan Note (Signed)
Ordered mammogram.

## 2016-08-05 NOTE — Assessment & Plan Note (Addendum)
Rachel Murray is on chronic opioid therapy for chronic pain. The date of the controlled substances contract is referenced in the [place]. As part of the treatment plan, the [place] controlled substance database is checked at least twice yearly and the database results are appropriate. I have last reviewed the results on 08/05/2016.   The last UDS was on 10/2015 and results are as expected. Patient needs at least a yearly UDS.   The patient is on tramadol (Ultram) strength 50 mg TID prn, 90 tablets per 30 days. Adjunctive treatment includes gabapentin. This regimen allows Lezlie Lye to function and does not cause excessive sedation or other side effects.  "The benefits of continuing opioid therapy outweigh the risks and chronic opioids will be continued. Ongoing education about safe opioid treatment is provided  Interventions today include: Once paper rx printed.

## 2016-08-05 NOTE — Assessment & Plan Note (Signed)
I think her back pain when she voids is likely 2/2 to back spasms. Previously had KUB which was negative for any kidney stones. UA is unremarkable for any signs of UTI. Asked her to do back stretching and see if improves. Asked her to call us if she has fever, dysuria, or any other symptoms.   If fails to improve, may benefit from CT urogram.

## 2016-08-06 ENCOUNTER — Telehealth: Payer: Self-pay | Admitting: *Deleted

## 2016-08-06 NOTE — Telephone Encounter (Addendum)
Call to speak with patient about appointment for Mammogram  Patient has been scheduled for 08/27/2016 at 10:10 AM at the [facility].  Daughter wanted to know about patient's prescriptions that were written yesterday for Tramadol and Gabapentin.  Is will ing to come to Clinics to pick up as Pharmacy said that they did not get in [place].  Check for prescription called to [facility] in [place] did not receive prescription for Gabapentin there.  Prescription was sent to [facility] in [place] instead.  Prescription for Gabapentin 300 mg was called to [facility] in [place] and cancelled in [place].  Prescription for Tramadol was sent to [facility] in  [place] and is awaiting pick up.  Patient's daughter was called and informed of .  Regino Schultze Amariss Detamore,RN 08/06/2016 4:18 PM

## 2016-08-23 ENCOUNTER — Other Ambulatory Visit: Payer: Self-pay

## 2016-08-23 DIAGNOSIS — J45991 Cough variant asthma: Secondary | ICD-10-CM

## 2016-08-23 NOTE — Telephone Encounter (Signed)
  albuterol (PROVENTIL HFA) 108 (90 Base) MCG/ACT inhale, @ [facility] in [place].

## 2016-08-26 MED ORDER — ALBUTEROL SULFATE HFA 108 (90 BASE) MCG/ACT IN AERS: 2.0000 | INHALATION_SPRAY | Freq: Four times a day (QID) | RESPIRATORY_TRACT | 3 refills | Status: DC | PRN

## 2016-08-27 ENCOUNTER — Ambulatory Visit: Payer: Medicare Other

## 2016-09-02 ENCOUNTER — Encounter: Payer: Self-pay | Admitting: Internal Medicine

## 2016-09-02 ENCOUNTER — Encounter: Payer: Medicare Other | Admitting: Internal Medicine

## 2016-09-05 ENCOUNTER — Encounter: Payer: Medicare Other | Admitting: Internal Medicine

## 2016-09-06 ENCOUNTER — Other Ambulatory Visit: Payer: Self-pay | Admitting: *Deleted

## 2016-09-06 ENCOUNTER — Other Ambulatory Visit: Payer: Self-pay

## 2016-09-06 DIAGNOSIS — M5136 Other intervertebral disc degeneration, lumbar region: Secondary | ICD-10-CM

## 2016-09-06 MED ORDER — TRAMADOL HCL 50 MG PO TABS: 50.0000 mg | ORAL_TABLET | Freq: Three times a day (TID) | ORAL | 0 refills | Status: DC | PRN

## 2016-09-06 NOTE — Telephone Encounter (Signed)
Refilled 1 month Rx, Patient does need to be seen by PCP on 10/03/16.  She should not expect refills to be provided outside of clinical encounters.

## 2016-09-06 NOTE — Telephone Encounter (Signed)
[facility] in [place]. Called all listed ph#, spoke to patient's son & daughter & informed of rx status.  Gave them patient's  next OV date with pcp that patient needs to keep  this appt for future refills.

## 2016-09-06 NOTE — Telephone Encounter (Signed)
Patient very rude and was nasty because her prescription was not going to be ready for pick up this afternoon. Patient stated that she spoke to "Yvonna Alanis" today but, she is out of the office today. Patient then Threatened to call "[employer]" because this is a bunch of Bull Crap.  Explained the 48 hours turn around time for rx to be picked up and the patient slammed the phone.

## 2016-09-06 NOTE — Telephone Encounter (Signed)
Pt called and yelled on the phone stating she needs her traMADol (ULTRAM) 50 MG tablet, to be filled @ [facility]. Stating she needs the med by today or otherwise her daughter will come to the clinic.

## 2016-10-03 ENCOUNTER — Encounter: Payer: Self-pay | Admitting: Internal Medicine

## 2016-10-03 ENCOUNTER — Ambulatory Visit (INDEPENDENT_AMBULATORY_CARE_PROVIDER_SITE_OTHER): Payer: Medicare Other | Admitting: Internal Medicine

## 2016-10-03 VITALS — BP 142/67 | HR 88 | Temp 97.9°F | Wt 146.6 lb

## 2016-10-03 DIAGNOSIS — Z823 Family history of stroke: Secondary | ICD-10-CM

## 2016-10-03 DIAGNOSIS — G8929 Other chronic pain: Secondary | ICD-10-CM | POA: Diagnosis not present

## 2016-10-03 DIAGNOSIS — Z90710 Acquired absence of both cervix and uterus: Secondary | ICD-10-CM

## 2016-10-03 DIAGNOSIS — D649 Anemia, unspecified: Secondary | ICD-10-CM | POA: Diagnosis not present

## 2016-10-03 DIAGNOSIS — Z0000 Encounter for general adult medical examination without abnormal findings: Secondary | ICD-10-CM

## 2016-10-03 DIAGNOSIS — Z801 Family history of malignant neoplasm of trachea, bronchus and lung: Secondary | ICD-10-CM

## 2016-10-03 DIAGNOSIS — Z8619 Personal history of other infectious and parasitic diseases: Secondary | ICD-10-CM | POA: Diagnosis not present

## 2016-10-03 DIAGNOSIS — M5136 Other intervertebral disc degeneration, lumbar region: Secondary | ICD-10-CM

## 2016-10-03 DIAGNOSIS — Z800 Family history of malignant neoplasm of digestive organs: Secondary | ICD-10-CM | POA: Diagnosis not present

## 2016-10-03 DIAGNOSIS — D692 Other nonthrombocytopenic purpura: Secondary | ICD-10-CM | POA: Insufficient documentation

## 2016-10-03 DIAGNOSIS — J45991 Cough variant asthma: Secondary | ICD-10-CM

## 2016-10-03 DIAGNOSIS — R413 Other amnesia: Secondary | ICD-10-CM

## 2016-10-03 DIAGNOSIS — R233 Spontaneous ecchymoses: Secondary | ICD-10-CM

## 2016-10-03 DIAGNOSIS — Z803 Family history of malignant neoplasm of breast: Secondary | ICD-10-CM

## 2016-10-03 DIAGNOSIS — M419 Scoliosis, unspecified: Secondary | ICD-10-CM

## 2016-10-03 DIAGNOSIS — Z87891 Personal history of nicotine dependence: Secondary | ICD-10-CM | POA: Diagnosis not present

## 2016-10-03 DIAGNOSIS — R202 Paresthesia of skin: Secondary | ICD-10-CM | POA: Diagnosis not present

## 2016-10-03 DIAGNOSIS — Z820 Family history of epilepsy and other diseases of the nervous system: Secondary | ICD-10-CM

## 2016-10-03 DIAGNOSIS — T148XXA Other injury of unspecified body region, initial encounter: Secondary | ICD-10-CM

## 2016-10-03 DIAGNOSIS — Z8249 Family history of ischemic heart disease and other diseases of the circulatory system: Secondary | ICD-10-CM

## 2016-10-03 MED ORDER — ALBUTEROL SULFATE HFA 108 (90 BASE) MCG/ACT IN AERS: 2.0000 | INHALATION_SPRAY | Freq: Four times a day (QID) | RESPIRATORY_TRACT | 3 refills | Status: AC | PRN

## 2016-10-03 MED ORDER — GABAPENTIN 300 MG PO CAPS: 300.0000 mg | ORAL_CAPSULE | Freq: Three times a day (TID) | ORAL | 0 refills | Status: DC

## 2016-10-03 MED ORDER — TRAMADOL HCL 50 MG PO TABS: 50.0000 mg | ORAL_TABLET | Freq: Three times a day (TID) | ORAL | 0 refills | Status: DC | PRN
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
## 2016-10-03 NOTE — Progress Notes (Signed)
Medicine attending: I personally interviewed and briefly examined this patient on the day of the patient visit and reviewed pertinent clinical and laboratory data  with resident physician Dr. Holley Raring and we discussed a management plan. She has a tiny bruise on her left forearm of no consequence. CBCs in past have all had normal platelet counts. She was on ASA but stopped it. Will will get additional testing re chronic normochromic anemia.

## 2016-10-03 NOTE — Assessment & Plan Note (Signed)
Patient has a history of normocytic anemia for several years. Last CBC was checked in October 2016 with hemoglobin of 10 and MCV of 97. Patient has had evaluation over the last several years with iron studies, B12, folate. She was previously on B12 and iron supplementation. Recently the patient has noted that she has had some discomfort of her legs that awakes her from sleep at night she feels some tingling sensation and discomfort which is not quite "painful" but does feel uncomfortable. Patient is status post hysterectomy many years ago and does not have any other signs of bleeding from her GI tract.  Patient denies any fatigue, shortness of breath, has no pale skin on exam. Sensation is intact throughout her lower extremities.  Assessment: History of normocytic anemia which is been chronic over several years, new onset of lower extremity paresthesia and discomfort which I'm concerned may related to iron deficiency anemia.  Plan: Recheck CBC, iron studies, B12 for further evaluation of her anemia

## 2016-10-03 NOTE — Assessment & Plan Note (Signed)
Patient denies any symptoms of respiratory distress, dyspnea, or wheezing. She is out of her albuterol inhaler and I refilled this for her to use as needed.

## 2016-10-03 NOTE — Assessment & Plan Note (Signed)
Patient's family reports concern of memory loss, forgetfulness, repetition of her speech, and occasional symptoms of "being out of it". Patient denies any of the symptoms herself although she does endorse that occasionally she will have some mild forgetfulness. Patient appears well groomed, particularly on exam. She has intact mental process and is able to carry conversation. She has a mild amount of repetition throughout our interview which does not appear pathological. Her concentration is excellent, and she is able to complete a short neurocognitive evaluation with ease.  Assessment: At this time I do not feel the patient has any significant neurocognitive decline suggestive of all time his dementia. Changes may be subtle the onset of dementia but at this time do not find any compelling evidence for pharmacotherapy for further neurocognitive evaluation. I will continue to evaluate this at future visits, and I have recommended that the patient be evaluated for symptoms should change significantly.  Plan: Continue monitoring and repeat evaluation with formal mini cog assessment for referral to neurology for neurocognitive evaluation if her symptoms continue to change or decline.

## 2016-10-03 NOTE — Assessment & Plan Note (Signed)
Patient has a long history of degenerative disc disease in her lumbar spine and scoliosis which has been present also for chronic low back pain radiating to her legs. Patient has a pain contract and has been prescribed tramadol for clinic on a regular basis she uses this medication responsibly and I do not have any red flags. This was last prescribed by Dr. Genene Churn at her visit in March for one month supply. I prescribed another one month supply of this medication for her today.

## 2016-10-03 NOTE — Assessment & Plan Note (Signed)
The patient's primary concern at her appointment today are the lesions which she reports on her upper and lower extremities that began in January. These lesions appear to be bruising which are small roughly the size of a nickel to quarter. Patient reports that they appear spontaneously without any known trauma, 10 to expand in size and then resolve spontaneously. They are occasionally itchy or associated with mild discomfort. Patient is very concerned by these findings and requests further evaluation and blood testing. Patient does have a history of hepatitis B after blood transfusion prior to regular blood screening. She also has a history of anemia and has never had evaluation for amyloidosis or myeloma. We will obtain laboratory evaluation testing today in the setting of these new symptoms.  Assessment: Spontaneous bruising of the upper and lower extremities in a patient with history of hepatitis and chronic anemia of unknown cause which is somewhat concerning for amyloidosis or myeloma  Plan: Obtain CBC, SPEP, IFE, CMP today.

## 2016-10-03 NOTE — Patient Instructions (Signed)
We will refill your tramadol today and send prescriptions for gabapentin and albuterol to the pharmacy.  We will do several blood tests to look for causes of your bruising. I will let you know about the results when they are back.

## 2016-10-03 NOTE — Assessment & Plan Note (Signed)
Patient had a mammogram and DEXA bone density scan ordered by Dr. Genene Churn at her last visit. Patient has completed these. I do not have the option to discuss this with the patient her current visit but would recommend the patient follow up with these tests at her convenience for health maintenance of her osteoporotic risk and cancer screening.

## 2016-10-06 LAB — CMP14 + ANION GAP
A/G RATIO: 1.7 (ref 1.2–2.2)
ALBUMIN: 4.3 g/dL (ref 3.6–4.8)
ALK PHOS: 97 IU/L (ref 39–117)
ALT: 10 IU/L (ref 0–32)
AST: 18 IU/L (ref 0–40)
Anion Gap: 15 mmol/L (ref 10.0–18.0)
BILIRUBIN TOTAL: 0.2 mg/dL (ref 0.0–1.2)
BUN / CREAT RATIO: 15 (ref 12–28)
BUN: 18 mg/dL (ref 8–27)
CHLORIDE: 103 mmol/L (ref 96–106)
CO2: 25 mmol/L (ref 18–29)
Calcium: 9.5 mg/dL (ref 8.7–10.3)
Creatinine, Ser: 1.17 mg/dL — ABNORMAL HIGH (ref 0.57–1.00)
GFR calc non Af Amer: 48 mL/min/{1.73_m2} — ABNORMAL LOW (ref >59)
GFR, EST AFRICAN AMERICAN: 55 mL/min/{1.73_m2} — AB (ref >59)
Globulin, Total: 2.5 g/dL (ref 1.5–4.5)
Glucose: 132 mg/dL — ABNORMAL HIGH (ref 65–99)
POTASSIUM: 4.8 mmol/L (ref 3.5–5.2)
SODIUM: 143 mmol/L (ref 134–144)
TOTAL PROTEIN: 6.8 g/dL (ref 6.0–8.5)

## 2016-10-06 LAB — PROTEIN ELECTROPHORESIS, SERUM
A/G RATIO SPE: 1.3 (ref 0.7–1.7)
ALBUMIN ELP: 3.8 g/dL (ref 2.9–4.4)
Alpha 1: 0.2 g/dL (ref 0.0–0.4)
Alpha 2: 0.7 g/dL (ref 0.4–1.0)
Beta: 0.9 g/dL (ref 0.7–1.3)
GLOBULIN, TOTAL: 3 g/dL (ref 2.2–3.9)
Gamma Globulin: 1.2 g/dL (ref 0.4–1.8)

## 2016-10-06 LAB — IMMUNOFIXATION, SERUM
IGG (IMMUNOGLOBIN G), SERUM: 1155 mg/dL (ref 700–1600)
IgA/Immunoglobulin A, Serum: 127 mg/dL (ref 87–352)
IgM (Immunoglobulin M), Srm: 93 mg/dL (ref 26–217)

## 2016-10-06 LAB — CBC
HEMATOCRIT: 30.5 % — AB (ref 34.0–46.6)
Hemoglobin: 10.4 g/dL — ABNORMAL LOW (ref 11.1–15.9)
MCH: 33.2 pg — ABNORMAL HIGH (ref 26.6–33.0)
MCHC: 34.1 g/dL (ref 31.5–35.7)
MCV: 97 fL (ref 79–97)
PLATELETS: 245 10*3/uL (ref 150–379)
RBC: 3.13 x10E6/uL — ABNORMAL LOW (ref 3.77–5.28)
RDW: 13.8 % (ref 12.3–15.4)
WBC: 5.1 10*3/uL (ref 3.4–10.8)

## 2016-10-06 LAB — IRON AND TIBC
IRON SATURATION: 33 % (ref 15–55)
IRON: 112 ug/dL (ref 27–139)
Total Iron Binding Capacity: 335 ug/dL (ref 250–450)
UIBC: 223 ug/dL (ref 118–369)

## 2016-10-06 LAB — FERRITIN: FERRITIN: 29 ng/mL (ref 15–150)

## 2016-10-06 LAB — VITAMIN B12: VITAMIN B 12: 353 pg/mL (ref 232–1245)

## 2016-10-06 LAB — SEDIMENTATION RATE: SED RATE: 17 mm/h (ref 0–40)

## 2016-10-10 ENCOUNTER — Encounter: Payer: Self-pay | Admitting: Internal Medicine

## 2016-10-10 NOTE — Addendum Note (Signed)
Addended by: Holley Raring A on: 10/10/2016 07:37 AM   Modules accepted: Orders

## 2016-11-08 ENCOUNTER — Encounter: Payer: Self-pay | Admitting: *Deleted

## 2016-11-08 ENCOUNTER — Other Ambulatory Visit: Payer: Self-pay | Admitting: Internal Medicine

## 2016-11-08 DIAGNOSIS — M5136 Other intervertebral disc degeneration, lumbar region: Secondary | ICD-10-CM

## 2016-11-08 MED ORDER — TRAMADOL HCL 50 MG PO TABS: 50.0000 mg | ORAL_TABLET | Freq: Three times a day (TID) | ORAL | 1 refills | Status: DC | PRN

## 2016-11-08 NOTE — Telephone Encounter (Signed)
NEEDS MEDICATION REFILL SEND TO [facility], [address],[place]

## 2016-11-08 NOTE — Telephone Encounter (Signed)
Called to pharmacy 

## 2016-11-11 ENCOUNTER — Encounter (HOSPITAL_COMMUNITY): Payer: Self-pay

## 2016-11-11 ENCOUNTER — Emergency Department (HOSPITAL_COMMUNITY)
Admission: EM | Admit: 2016-11-11 | Discharge: 2016-11-11 | Disposition: A | Discharge status: Home / Self Care | Payer: Medicare Other | Attending: Emergency Medicine | Admitting: Emergency Medicine

## 2016-11-11 ENCOUNTER — Telehealth: Payer: Self-pay | Admitting: *Deleted

## 2016-11-11 DIAGNOSIS — L239 Allergic contact dermatitis, unspecified cause: Secondary | ICD-10-CM | POA: Diagnosis not present

## 2016-11-11 DIAGNOSIS — Z7982 Long term (current) use of aspirin: Secondary | ICD-10-CM | POA: Insufficient documentation

## 2016-11-11 DIAGNOSIS — J45909 Unspecified asthma, uncomplicated: Secondary | ICD-10-CM | POA: Diagnosis not present

## 2016-11-11 DIAGNOSIS — Z8541 Personal history of malignant neoplasm of cervix uteri: Secondary | ICD-10-CM | POA: Diagnosis not present

## 2016-11-11 DIAGNOSIS — Z79899 Other long term (current) drug therapy: Secondary | ICD-10-CM | POA: Diagnosis not present

## 2016-11-11 DIAGNOSIS — L989 Disorder of the skin and subcutaneous tissue, unspecified: Secondary | ICD-10-CM | POA: Diagnosis present

## 2016-11-11 MED ORDER — HYDROCORTISONE 1 % EX CREA: TOPICAL_CREAM | CUTANEOUS | 0 refills | Status: AC

## 2016-11-11 MED ORDER — DIPHENHYDRAMINE HCL 25 MG PO CAPS
25.0000 mg | ORAL_CAPSULE | Freq: Once | ORAL | Status: AC
  Administered 2016-11-11: 25 mg via ORAL
  Filled 2016-11-11: qty 1

## 2016-11-11 MED ORDER — DIPHENHYDRAMINE HCL 25 MG PO CAPS: 25.0000 mg | ORAL_CAPSULE | Freq: Four times a day (QID) | ORAL | 0 refills | Status: AC | PRN

## 2016-11-11 NOTE — ED Notes (Signed)
See provider assessment 

## 2016-11-11 NOTE — Telephone Encounter (Signed)
Pt walked in today, clinic closing at 1300 due to matters beyond cone's control. She is c/o pain in her neck, also of not getting her gabapentin, spoke w/ pharmacist at [facility] and she states it was filled 5/13 and pt stated she did not want to pick it up, she was ask to prepare for pick up and pt should pick up today. Attempted to call pt, no answer, no vmail Pt has gone to ED
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
## 2016-11-11 NOTE — ED Triage Notes (Signed)
Pt states she was at a family reunion yesterday. She reports she has neck pain bilaterally on the lateral area of her neck. No point tenderness noted to the neck. She also reports she bas several bug bites and would like to know what they are.

## 2016-11-14 ENCOUNTER — Ambulatory Visit (INDEPENDENT_AMBULATORY_CARE_PROVIDER_SITE_OTHER): Payer: Medicare Other | Admitting: Internal Medicine

## 2016-11-14 VITALS — BP 133/66 | HR 92 | Temp 98.6°F | Wt 139.7 lb

## 2016-11-14 DIAGNOSIS — L239 Allergic contact dermatitis, unspecified cause: Secondary | ICD-10-CM | POA: Insufficient documentation

## 2016-11-14 DIAGNOSIS — R413 Other amnesia: Secondary | ICD-10-CM | POA: Diagnosis not present

## 2016-11-14 DIAGNOSIS — M419 Scoliosis, unspecified: Secondary | ICD-10-CM | POA: Diagnosis not present

## 2016-11-14 DIAGNOSIS — Z8639 Personal history of other endocrine, nutritional and metabolic disease: Secondary | ICD-10-CM

## 2016-11-14 DIAGNOSIS — Z800 Family history of malignant neoplasm of digestive organs: Secondary | ICD-10-CM

## 2016-11-14 DIAGNOSIS — Z8249 Family history of ischemic heart disease and other diseases of the circulatory system: Secondary | ICD-10-CM

## 2016-11-14 DIAGNOSIS — Z0000 Encounter for general adult medical examination without abnormal findings: Secondary | ICD-10-CM

## 2016-11-14 DIAGNOSIS — M5136 Other intervertebral disc degeneration, lumbar region: Secondary | ICD-10-CM

## 2016-11-14 DIAGNOSIS — Z803 Family history of malignant neoplasm of breast: Secondary | ICD-10-CM

## 2016-11-14 DIAGNOSIS — D649 Anemia, unspecified: Secondary | ICD-10-CM

## 2016-11-14 DIAGNOSIS — R296 Repeated falls: Secondary | ICD-10-CM | POA: Diagnosis not present

## 2016-11-14 DIAGNOSIS — Z823 Family history of stroke: Secondary | ICD-10-CM

## 2016-11-14 DIAGNOSIS — Z9181 History of falling: Secondary | ICD-10-CM | POA: Diagnosis not present

## 2016-11-14 DIAGNOSIS — Z820 Family history of epilepsy and other diseases of the nervous system: Secondary | ICD-10-CM

## 2016-11-14 DIAGNOSIS — J45909 Unspecified asthma, uncomplicated: Secondary | ICD-10-CM

## 2016-11-14 DIAGNOSIS — Z7982 Long term (current) use of aspirin: Secondary | ICD-10-CM | POA: Diagnosis not present

## 2016-11-14 DIAGNOSIS — Z801 Family history of malignant neoplasm of trachea, bronchus and lung: Secondary | ICD-10-CM

## 2016-11-14 MED ORDER — GABAPENTIN 300 MG PO CAPS: 300.0000 mg | ORAL_CAPSULE | Freq: Three times a day (TID) | ORAL | 0 refills | Status: DC

## 2016-11-14 NOTE — Patient Instructions (Addendum)
Thanks so much for coming in to see me today!  I would like you to have an evaluation by a spine doctor and a neurologist in the next week.  I will write a prescription for a cane.

## 2016-11-14 NOTE — Assessment & Plan Note (Signed)
I again wished to discuss the patient's screening mammography and DEXA scanning as patient is due for these. Patient has had several falls throughout the last year and is at significant increased risk for osteoporotic fracture with history of vitamin D deficiency. Patient had other complaints and I was unable to discuss this today. Both of these tests have been ordered several times in the past but never the completed.

## 2016-11-14 NOTE — Progress Notes (Signed)
Medicine attending: Medical history, presenting problems, physical findings, and medications, reviewed with resident physician Dr Holley Raring on the day of the patient visit and I concur with his evaluation and management plan.
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
## 2016-11-14 NOTE — Assessment & Plan Note (Signed)
Family continues to report concerns about the patient's worsening memory. They note that she has episodic memory loss, that her thoughts are worsening only scattered and distracted. They also note that patient has had worsening balance, a right-sided facial droop which has developed some point over the last month, and they specifically request evaluation for dementia.  On today's exam the patient does have distracted and somewhat scattered conversation. She also appears more anxious and easily excitable, compared to her last visit with me in May. Patient is able to answer orientation questions appropriately spell world forwards and backwards, complete serial sevens 2 but incorrectly on the third subtraction. She does have immediate recall of 3 words, but reported these backwards. Patient has mild right facial droop but no other focal neurological deficits. Patient is significantly rightward leaning on gait and does appear to have balance issues with several episodes of stumbling. This may be multifactorial with contributing from her scoliosis and radicular pain symptoms.  Patient does not have any other significant history of vascular disease such as coronary artery disease, peripheral vascular disease and is currently only on 81 mg aspirin daily for primary prophylaxis.  Assessment: Patient with reported worsening of memory, increasing mood lability, imbalance, right facial droop which are all concerning for vascular dementia. Patient would benefit from formal evaluation with mini cog testing.  Plan: Referral to neurology for formal dementia evaluation. Patient may benefit from MRI imaging of brain. We'll hold off on treatment with anticholinergics as I suspect vascular dementia more likely than Alzheimer's. We'll also hold off on primary treatment for anxiety with benzos as this may be secondary to CNS process and would like to avoid over sedation with benzodiazepines given concerns about sedation with  current dose of gabapentin.

## 2016-11-14 NOTE — Assessment & Plan Note (Signed)
Patient continues to complain of radicular symptoms of the lumbar spine including bilateral hips and upper thighs as well as sporadic symptoms in the neck and upper extremities bilaterally. These pains have reportedly been worsening over the last several months but are controlled with gabapentin. Patient does have breakthrough symptoms in between dosing of gabapentin. She is currently on 300 mg 3 times a day but reports some significant sedation on this dose during the daytime.  Assessment: Worsening degenerative disc disease associated with scoliosis of the lumbar spine  Plan: Continue gabapentin 300 mg 3 times a day Consider increasing evening dose due to concerns of daytime sedation if symptoms are not controlled Consider Lyrica as an alternative, patient reports she has tried Lyrica in the past and was not agreeable to trial of this again she feels gabapentin is controlling her symptoms well enough.

## 2016-11-14 NOTE — Assessment & Plan Note (Addendum)
Long h/o scoliosis. Reportedly seen by spine doctor several years ago recommended against surgery. No other specific interventions were recommended at that time according to the patient. Patient reports that she feels her scoliosis is worsened recently and she has significant imbalance with walking. On my exam the patient has significant rightward leaning with unsteadiness on her feet. Patient also reports worsening neuropathic radicular symptoms both in the cervical and lumbar spine. She was noted to have diffuse degenerative disc disease in the lumbar spine on plain film in 2016, and my suspicion is that this is worsened for the last several years. Patient's been taking gabapentin for some time and reports that this is successful in alleviating her symptoms but she does do to complain of intermittent burning and sharp pains which appear sporadically in the bilateral hips lower limbs and upper limbs.  Assessment: Chronic scoliosis with worsening of rightward leaning and gait instability over the last month  Plan: Referral to spine specialist for further evaluation. Patient may benefit from brace, and would also benefit from evaluation of other modalities for treatment of degenerative disc disease such as spinal injection.

## 2016-11-18 ENCOUNTER — Encounter: Payer: Self-pay | Admitting: Neurology

## 2016-12-06 ENCOUNTER — Other Ambulatory Visit: Payer: Self-pay

## 2016-12-06 DIAGNOSIS — M5136 Other intervertebral disc degeneration, lumbar region: Secondary | ICD-10-CM

## 2016-12-06 NOTE — Telephone Encounter (Signed)
traMADol (ULTRAM) [facility] tablet [facility] [place]. Please call pt back.

## 2016-12-06 NOTE — Telephone Encounter (Signed)
Tried to call pt to obtain pharmacy info, [facility] [place] [facility], no answer, no vmail

## 2016-12-09 ENCOUNTER — Other Ambulatory Visit: Payer: Self-pay | Admitting: *Deleted

## 2016-12-09 DIAGNOSIS — M5136 Other intervertebral disc degeneration, lumbar region: Secondary | ICD-10-CM

## 2016-12-10 NOTE — Telephone Encounter (Signed)
[facility], cancelled it and [facility] [place] as ask by pt, [facility]

## 2017-01-07 ENCOUNTER — Other Ambulatory Visit: Payer: Self-pay

## 2017-01-07 ENCOUNTER — Ambulatory Visit: Payer: Medicare Other | Admitting: Neurology

## 2017-01-07 DIAGNOSIS — M5136 Other intervertebral disc degeneration, lumbar region: Secondary | ICD-10-CM

## 2017-01-07 NOTE — Telephone Encounter (Signed)
traMADol (ULTRAM) 50 MG tablet(Expired), [facility] [place], [place].

## 2017-01-07 NOTE — Telephone Encounter (Addendum)
Called pt to verify correct pharmacy - [facility] but [facility] was [facility] [place]. Finally, I talked to her son, stated  [facility]

## 2017-01-07 NOTE — Telephone Encounter (Signed)
It looks like she spends part of her time here and part of the year in [place].  It looks like from the last note she is due to return for follow up in September, could you call and get her an appointment for follow up, once we have an appointment on the books I should know if I need to provide an additional refill or not.

## 2017-01-08 ENCOUNTER — Encounter: Payer: Self-pay | Admitting: Internal Medicine

## 2017-01-08 MED ORDER — TRAMADOL HCL 50 MG PO TABS: 50.0000 mg | ORAL_TABLET | Freq: Three times a day (TID) | ORAL | 1 refills | Status: DC | PRN

## 2017-01-08 NOTE — Telephone Encounter (Signed)
I agree with Dr. Heber Humacao that she should have a future appointment so these refills can be done in office rather than over the phone. I have approved a two month supply, which should be plenty of time for her to come in and see her new PCP. Please phone in the refill. Thanks.

## 2017-01-08 NOTE — Telephone Encounter (Signed)
Tramadol rx called to [facility] in [place].

## 2017-02-12 ENCOUNTER — Telehealth: Payer: Self-pay | Admitting: *Deleted

## 2017-02-12 ENCOUNTER — Encounter: Payer: Self-pay | Admitting: *Deleted

## 2017-02-12 NOTE — Telephone Encounter (Signed)
.  A user error has taken place: encounter opened in error, closed for administrative reasons.  Duplicate request

## 2017-02-12 NOTE — Telephone Encounter (Signed)
Spoke w/ pt, she is asking for refill of tramadol and rambling about ortho appts, being in [place] and not being able to come to an appt tomorrow, about a "not real doctor" that talked "ugly" to her several years ago and what she should have done to the doctor, I ask her to please focus on what she needed today and she stated she needed her tramadol and an appt, informed her 2x when her next appt is scheduled and told her I would call the pharmacy, ask for clarification of [facility]", informed [facility] [place]. Called they will fill tramadol today, last refilled 8/8.

## 2017-02-13 ENCOUNTER — Other Ambulatory Visit: Payer: Self-pay | Admitting: *Deleted

## 2017-02-13 ENCOUNTER — Encounter: Payer: Medicare Other | Admitting: Internal Medicine

## 2017-02-13 DIAGNOSIS — M5136 Other intervertebral disc degeneration, lumbar region: Secondary | ICD-10-CM

## 2017-02-13 MED ORDER — GABAPENTIN 300 MG PO CAPS: 300.0000 mg | ORAL_CAPSULE | Freq: Three times a day (TID) | ORAL | 0 refills | Status: DC

## 2017-02-13 NOTE — Addendum Note (Signed)
Addended by: Hulan Fray on: 02/13/2017 01:38 PM   Modules accepted: Orders

## 2017-03-03 ENCOUNTER — Ambulatory Visit (INDEPENDENT_AMBULATORY_CARE_PROVIDER_SITE_OTHER): Payer: Medicare Other | Admitting: Internal Medicine

## 2017-03-03 VITALS — BP 105/72 | HR 78 | Temp 98.1°F | Ht 67.0 in | Wt 135.4 lb

## 2017-03-03 DIAGNOSIS — Z79891 Long term (current) use of opiate analgesic: Secondary | ICD-10-CM

## 2017-03-03 DIAGNOSIS — R202 Paresthesia of skin: Secondary | ICD-10-CM

## 2017-03-03 DIAGNOSIS — R350 Frequency of micturition: Secondary | ICD-10-CM | POA: Diagnosis not present

## 2017-03-03 DIAGNOSIS — G8929 Other chronic pain: Secondary | ICD-10-CM

## 2017-03-03 DIAGNOSIS — M47812 Spondylosis without myelopathy or radiculopathy, cervical region: Secondary | ICD-10-CM | POA: Diagnosis not present

## 2017-03-03 DIAGNOSIS — M545 Low back pain: Secondary | ICD-10-CM | POA: Diagnosis not present

## 2017-03-03 DIAGNOSIS — M4802 Spinal stenosis, cervical region: Secondary | ICD-10-CM

## 2017-03-03 DIAGNOSIS — M79609 Pain in unspecified limb: Secondary | ICD-10-CM

## 2017-03-03 DIAGNOSIS — Z79899 Other long term (current) drug therapy: Secondary | ICD-10-CM | POA: Diagnosis not present

## 2017-03-03 NOTE — Assessment & Plan Note (Addendum)
Patient is here today with complaint of urinary frequency. She reports her symptoms are intermittent. She is currently asymptomatic. She denies dysuria, fever, vaginal discharge, and she is not currently sexually active. UA notable only for mild leukocytes. Patient has declined vaginal exam. -- Monitor for now

## 2017-03-03 NOTE — Assessment & Plan Note (Signed)
Patient is complaining of chronic left upper extremity paresthesias. She reports the paresthesias involve her entire upper extremity from her neck to her fingertips. On exam, her strength and sensation to light touch is intact (although effort was poor). No evidence of carpal tunnel. Plain films taken on 08/19/2014 of her cervical spine revealed spondylosis and early left neural foraminal narrowing at C3-4. Patient reports seeing a spine specialist around that time who recommended conservative management. She is currently taking gabapentin 300 mg 3 times daily, her when necessary tramadol, and additional Tylenol when needed. She feels that this regimen adequately controls her symptoms. She is inquiring a physical therapy referral today. Previously following with a chiropractor. -- Continue current regimen -- PT referral placed
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
## 2017-03-03 NOTE — Progress Notes (Signed)
Internal Medicine Clinic Attending  Case discussed with Dr. Guilloud at the time of the visit.  We reviewed the resident's history and exam and pertinent patient test results.  I agree with the assessment, diagnosis, and plan of care documented in the resident's note.  

## 2017-03-03 NOTE — Assessment & Plan Note (Addendum)
Patient is on tramadol 50 mg every 8 hours when necessary for chronic lumbar back pain. Last refill written on 02/13/2017. She has her prescription bottle with her today that is mostly full. [place] controlled substance database was reviewed and refill history is appropriate. Last UDS in May 2017 was appropriate for tramadol metabolite. She reports last taking tramadol this morning. Denies illicit substance use. -- F/u UDS -- Instructed patient to call for refills when needed   ADDENDUM: UDS appropriate for tramadol and metabolites.

## 2017-03-03 NOTE — Patient Instructions (Signed)
Rachel Murray,  It was a pleasure to see you today. We tested your urine today and there was no sign of infection. You may continue to take gabapentin, tramadol, and tylenol or ibuprofen as needed for your back and arm pain. I have referred you to a physical therapist. You will be contacted for an appointment. If you have any questions or concerns, call our clinic at [phone] or after hours call [phone] and ask for the internal medicine resident on call. Thank you!  - Dr. Philipp Ovens

## 2017-03-07 LAB — TOXASSURE SELECT,+ANTIDEPR,UR

## 2017-03-19 ENCOUNTER — Other Ambulatory Visit: Payer: Self-pay | Admitting: Student in an Organized Health Care Education/Training Program

## 2017-03-19 DIAGNOSIS — M5136 Other intervertebral disc degeneration, lumbar region: Secondary | ICD-10-CM

## 2017-03-19 NOTE — Telephone Encounter (Signed)
Had to call in med under dr butcher's name and [place]

## 2017-03-19 NOTE — Telephone Encounter (Signed)
Hi Helen, can you please call this prescription in for me. Thank you

## 2017-03-19 NOTE — Telephone Encounter (Signed)
traMADol (ULTRAM) 50 MG tablet(Expired), Refill request. Requesting the nurse to call her back by today. Per patient she is out of meds and in pain.

## 2017-04-03 ENCOUNTER — Encounter: Payer: Medicare Other | Admitting: Internal Medicine

## 2017-04-04 NOTE — Addendum Note (Signed)
Addended by: Hulan Fray on: 04/04/2017 05:22 PM   Modules accepted: Orders

## 2017-04-26 ENCOUNTER — Encounter: Payer: Self-pay | Admitting: *Deleted

## 2017-06-05 ENCOUNTER — Encounter: Payer: Medicare Other | Admitting: Internal Medicine

## 2017-06-05 ENCOUNTER — Other Ambulatory Visit: Payer: Self-pay | Admitting: Internal Medicine

## 2017-06-05 DIAGNOSIS — M5136 Other intervertebral disc degeneration, lumbar region: Secondary | ICD-10-CM

## 2017-06-05 NOTE — Telephone Encounter (Signed)
traMADol (ULTRAM) 50 MG tablet, [facility] [address], Per patient she is completely out of tramadol for  two days, would like this med by today. Please call pt back.

## 2017-06-05 NOTE — Telephone Encounter (Addendum)
Return call made to patient to make her aware that rx has been approved-Rx phoned into [facility] [address] in [place].

## 2017-06-05 NOTE — Telephone Encounter (Signed)
Pt calling back, requesting Tramadol to be filled by today. Please call pt back.

## 2017-06-16 ENCOUNTER — Other Ambulatory Visit: Payer: Self-pay | Admitting: *Deleted

## 2017-06-16 DIAGNOSIS — M5136 Other intervertebral disc degeneration, lumbar region: Secondary | ICD-10-CM

## 2017-06-17 ENCOUNTER — Telehealth: Payer: Self-pay | Admitting: Internal Medicine

## 2017-06-17 ENCOUNTER — Other Ambulatory Visit: Payer: Self-pay | Admitting: *Deleted

## 2017-06-17 MED ORDER — GABAPENTIN 300 MG PO CAPS: 300.0000 mg | ORAL_CAPSULE | Freq: Three times a day (TID) | ORAL | 0 refills | Status: DC

## 2017-06-17 NOTE — Telephone Encounter (Signed)
Request already sent.

## 2017-06-17 NOTE — Telephone Encounter (Signed)
PT CALLED ABOUT HER MEDICATION AGAIN TODAY, JUST CALLED AND MADE REQUEST AFTER 1:00 Monday, TOLD  PATIENT IT IS A 48 HOUR TURN AROUND ON REFILLS

## 2017-06-18 ENCOUNTER — Other Ambulatory Visit: Payer: Self-pay | Admitting: *Deleted

## 2017-06-18 NOTE — Telephone Encounter (Signed)
traMADol (ULTRAM) 50 MG tablet, Refill request @ [facility] on [address], [place]. Pt states she really needs her med by today before she travel to [facility]. Pt states she she is upset with the clinic and would like a call back by today. Please call back.

## 2017-06-18 NOTE — Telephone Encounter (Signed)
I have also confirmed with pharmacist in [place] that pt had picked up 90 tabs tramadol 06/05/17, gaba is ready for pick up. I called patient & she denied picking up tramadol, she said it was gaba that she picked up on 06/05/17.  Again I explained it was the pharmacist that confirmed this info. Patient started yelling " why don't you get that in your head, I have to go thru these with you people over & over again.  "I'm gonna come there tomorrow & you better make sure I'll speak to the head huncho coz I'm fed up with your program there." She demands to be called by the head of the dept within an hour. Then she hang up.

## 2017-06-18 NOTE — Telephone Encounter (Signed)
Pt picked up her tramadol 1/3 Gabapentin is ready for pick up This was confirmed by calling the pharmacy in [place] and speaking w/ the pharmacist Pt stated to front office employee "my daughter signs your paycheck and she can [place] it" Sending to Conservation officer, nature and helberg

## 2017-06-18 NOTE — Telephone Encounter (Signed)
Thank you both for your help with Rachel Murray. Appreciate your work.

## 2017-06-19 ENCOUNTER — Encounter: Payer: Medicare Other | Admitting: Internal Medicine

## 2017-06-20 ENCOUNTER — Encounter: Payer: Self-pay | Admitting: Internal Medicine

## 2017-06-23 ENCOUNTER — Encounter: Payer: Self-pay | Admitting: Internal Medicine

## 2017-06-24 NOTE — Telephone Encounter (Signed)
Thank you for your documentation. I found similar issue back in April. I am sending her letter regarding her behavior. Pls let me know if this cont to be an issue. Dr Tarri Abernethy - prior notes indicated a concern for memory issues but [facility] testing nl. Pt referred to neurology for more in depth testing but cancelled appt. If this tele behavior is new, could be early sign of dementia. Might want to consider readdressing memory. Thanks

## 2017-07-15 ENCOUNTER — Other Ambulatory Visit: Payer: Self-pay | Admitting: Internal Medicine

## 2017-07-15 DIAGNOSIS — M51369 Other intervertebral disc degeneration, lumbar region without mention of lumbar back pain or lower extremity pain: Secondary | ICD-10-CM

## 2017-07-15 DIAGNOSIS — M5136 Other intervertebral disc degeneration, lumbar region: Secondary | ICD-10-CM

## 2017-07-15 NOTE — Telephone Encounter (Signed)
Next appt scheduled 2/28 with PCP. 

## 2017-07-15 NOTE — Telephone Encounter (Signed)
Patient requesting refills on tramadol 50mg  tablet, [facility] [place]. Patient is requesting speaking to [facility]

## 2017-07-16 MED ORDER — TRAMADOL HCL 50 MG PO TABS: ORAL_TABLET | ORAL | 0 refills | Status: DC

## 2017-07-31 ENCOUNTER — Encounter: Payer: Medicare Other | Admitting: Internal Medicine

## 2017-08-11 ENCOUNTER — Ambulatory Visit (INDEPENDENT_AMBULATORY_CARE_PROVIDER_SITE_OTHER): Payer: Medicare Other | Admitting: Internal Medicine

## 2017-08-11 ENCOUNTER — Other Ambulatory Visit: Payer: Self-pay

## 2017-08-11 ENCOUNTER — Encounter: Payer: Self-pay | Admitting: Internal Medicine

## 2017-08-11 VITALS — BP 140/75 | HR 87 | Temp 97.8°F | Ht 63.5 in | Wt 127.5 lb

## 2017-08-11 DIAGNOSIS — R531 Weakness: Secondary | ICD-10-CM | POA: Diagnosis not present

## 2017-08-11 DIAGNOSIS — Z0000 Encounter for general adult medical examination without abnormal findings: Secondary | ICD-10-CM

## 2017-08-11 DIAGNOSIS — G309 Alzheimer's disease, unspecified: Secondary | ICD-10-CM

## 2017-08-11 DIAGNOSIS — R29898 Other symptoms and signs involving the musculoskeletal system: Secondary | ICD-10-CM

## 2017-08-11 DIAGNOSIS — M4185 Other forms of scoliosis, thoracolumbar region: Secondary | ICD-10-CM

## 2017-08-11 DIAGNOSIS — M419 Scoliosis, unspecified: Secondary | ICD-10-CM

## 2017-08-11 DIAGNOSIS — F0280 Dementia in other diseases classified elsewhere without behavioral disturbance: Secondary | ICD-10-CM | POA: Diagnosis not present

## 2017-08-11 DIAGNOSIS — Z810 Family history of intellectual disabilities: Secondary | ICD-10-CM

## 2017-08-11 DIAGNOSIS — M6250 Muscle wasting and atrophy, not elsewhere classified, unspecified site: Secondary | ICD-10-CM | POA: Diagnosis not present
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
## 2017-08-11 NOTE — Patient Instructions (Signed)
Thank you for allowing [place] to provide your care. We are working to get a lot of tests ordered for you. The office will call you when these are available. I will see you back in 3 months and call you with the results of your blood work.

## 2017-08-11 NOTE — Assessment & Plan Note (Addendum)
Patient presented with her son today. Her son voices concerns about progressive short term memory lapses. He states that over the past 2 years she has struggled to remember things they have discussed, people she has met, and on a couple occasions left pots on the stove. He voices that she does not have issues remembering storied from her earlier years. The patient becomes very upset with this topic and feels her memory is not slipping. She admits to minor memory lapses but no more than anyone else. She does not have a family history of alzheimer's disease. She completed 13 years of education.   On PE the patient is resting comfortably in no acute distress. She does frequently divert the conversion to tell stories about the past. On MSSE her score is 25 of 30.   Based on the patient's and the family's concerns about progressive short term memory lapses I feel a formal dementia evaluation is necessary. We will start with a B12, RPR, TSH, Vitamin D, and MR head. If no reversible causes are identified we will consider starting medical management. I had a very long discussion with the son today encouraging him to begin discussing goals of care with his mom and siblings. I also encouraged him to consider finding a PCP closer to [facility] to establish a more consistent routine for his mother and hopefully decrease the risk of delirium. He voices understanding but would like to continue to have her care provided here. He states that he will talk with his siblings and his mother about goals of care.  Plan: - Checking B12, RPR, TSH, Vitamin D, and MR head - If no reversible causes identified will consider starting memantide - Goal of care discussions

## 2017-08-12 ENCOUNTER — Telehealth: Payer: Self-pay | Admitting: *Deleted

## 2017-08-12 ENCOUNTER — Encounter: Payer: Self-pay | Admitting: Internal Medicine

## 2017-08-12 DIAGNOSIS — R29898 Other symptoms and signs involving the musculoskeletal system: Secondary | ICD-10-CM | POA: Insufficient documentation

## 2017-08-12 LAB — TSH: TSH: 2.02 u[IU]/mL (ref 0.450–4.500)

## 2017-08-12 LAB — VITAMIN D 25 HYDROXY (VIT D DEFICIENCY, FRACTURES): Vit D, 25-Hydroxy: 13.9 ng/mL — ABNORMAL LOW (ref 30.0–100.0)

## 2017-08-12 LAB — RPR: RPR: NONREACTIVE

## 2017-08-12 LAB — VITAMIN B12: VITAMIN B 12: 287 pg/mL (ref 232–1245)

## 2017-08-12 NOTE — Progress Notes (Signed)
Internal Medicine Clinic Attending  Case discussed with Dr. Helberg at the time of the visit.  We reviewed the resident's history and exam and pertinent patient test results.  I agree with the assessment, diagnosis, and plan of care documented in the resident's note.    

## 2017-08-12 NOTE — Telephone Encounter (Signed)
SPOKE WITH DAUGHTER REGARDING HER MOM REFERRALS (ORTHO  / PT) PATIENT IS WANTING APPOINTMENTS CLOSE TOGETHER, HER MOM LIVES BETWEEN [place] AND [place]. REFERRAL WILL BE SENT  AND TO NOTE TO CALL DAUGHTER WITH THE APPOINTMENT.

## 2017-08-12 NOTE — Assessment & Plan Note (Signed)
Presenting with concerns of left upper extremity weakness. She states that this has been progressive been going on for over a year but feels that it is getting progressively worse. She thinks that is primarily related to her nerves because she also has numbness and tingling in the arm that is consistent with prior neuropathy. She is on gabapentin which helps to decrease the pain.  On physical exam she does have weakness of the left upper extremity grip strength compared to the right. She does have bilateral atrophy of the thenar eminence.  Based on her unilateral weakness. We will pursue an MRI of the cervical spine and refer her to orthopedic surgery.  Plan: - Refer to orthopedic surgery  - MRI cervical spine

## 2017-08-12 NOTE — Assessment & Plan Note (Signed)
Referred for mammogram and DEXA.

## 2017-08-12 NOTE — Assessment & Plan Note (Addendum)
Patient has severe scoliosis of the lumbar and thoracic spine. She feels that she is starting to have shorter stride length on the right, and starting to have progressive weakness of the left upper extremity. She states that she saw an orthopedic surgeon many years ago but is unsure how she saw. She has been using a back brace that she obtained from the local drugstore to help with her posture.  On physical exam she has severe scoliosis of the thoracic and lumbar spine with elevation of the left rib cage compared to the left, and elevation of the right superior anterior iliac spine compared to the left.  We discussed placing a referral to orthopedic surgery to discuss better support options. We will also pursue an MRI of the cervical spine given that she is having this new left upper extremity weakness.  Plan:  - Refer to orthopedic surgery  - MRI cervical spine for LUE weakness

## 2017-08-14 ENCOUNTER — Ambulatory Visit: Payer: Medicare Other | Admitting: Physical Therapy

## 2017-08-15 ENCOUNTER — Telehealth: Payer: Self-pay | Admitting: Internal Medicine

## 2017-08-15 ENCOUNTER — Ambulatory Visit: Payer: Medicare Other

## 2017-08-15 DIAGNOSIS — G309 Alzheimer's disease, unspecified: Secondary | ICD-10-CM

## 2017-08-15 NOTE — Telephone Encounter (Signed)
Patient's daughter would like formal neurology evaluation for patient's memory concerns. Referral placed.

## 2017-08-19 ENCOUNTER — Ambulatory Visit: Payer: Medicare Other | Admitting: Physical Therapy

## 2017-08-20 ENCOUNTER — Other Ambulatory Visit: Payer: Self-pay

## 2017-08-20 ENCOUNTER — Encounter: Payer: Self-pay | Admitting: Physical Therapy

## 2017-08-20 ENCOUNTER — Ambulatory Visit: Payer: Medicare Other | Attending: Internal Medicine | Admitting: Physical Therapy

## 2017-08-20 DIAGNOSIS — Z9181 History of falling: Secondary | ICD-10-CM | POA: Diagnosis present

## 2017-08-20 DIAGNOSIS — M545 Low back pain: Secondary | ICD-10-CM | POA: Diagnosis present

## 2017-08-20 DIAGNOSIS — G8929 Other chronic pain: Secondary | ICD-10-CM | POA: Insufficient documentation
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
## 2017-08-25 ENCOUNTER — Encounter: Payer: Self-pay | Admitting: Neurology

## 2017-08-25 ENCOUNTER — Ambulatory Visit: Payer: Medicare Other | Admitting: Physical Therapy

## 2017-08-26 ENCOUNTER — Other Ambulatory Visit: Payer: Self-pay | Admitting: Internal Medicine

## 2017-08-26 ENCOUNTER — Other Ambulatory Visit: Payer: Self-pay | Admitting: *Deleted

## 2017-08-26 DIAGNOSIS — M5136 Other intervertebral disc degeneration, lumbar region: Secondary | ICD-10-CM

## 2017-08-26 MED ORDER — TRAMADOL HCL 50 MG PO TABS: ORAL_TABLET | ORAL | 0 refills | Status: DC

## 2017-08-26 NOTE — Telephone Encounter (Signed)
This is done, see other note from today

## 2017-08-26 NOTE — Telephone Encounter (Signed)
Patient is completely out of medicine, daughter works here want to come go down and pick up medicine [phone] daughter number

## 2017-08-26 NOTE — Telephone Encounter (Signed)
Last seen in October. [place] for one month refill, but needs appt for re-evaluation within that month for further refills.

## 2017-08-27 ENCOUNTER — Other Ambulatory Visit: Payer: Self-pay | Admitting: *Deleted

## 2017-08-27 DIAGNOSIS — M5136 Other intervertebral disc degeneration, lumbar region: Secondary | ICD-10-CM

## 2017-08-27 MED ORDER — TRAMADOL HCL 50 MG PO TABS: ORAL_TABLET | ORAL | 0 refills | Status: DC

## 2017-08-27 MED FILL — traMADol HCL 50 MG TABS: 50 | 30 days supply | Qty: 90 | Fill #0

## 2017-08-27 NOTE — Telephone Encounter (Signed)
Successfully cancelled in [place]

## 2017-09-02 ENCOUNTER — Ambulatory Visit: Payer: Medicare Other | Attending: Internal Medicine | Admitting: Physical Therapy

## 2017-09-02 ENCOUNTER — Telehealth: Payer: Self-pay | Admitting: Physical Therapy

## 2017-09-02 NOTE — Telephone Encounter (Signed)
Spoke with daughter, unaware of appointment today and will contact us to schedule further appointments. Rachel Murray [name] PT, DPT 09/02/17 4:18 PM

## 2017-09-03 ENCOUNTER — Ambulatory Visit: Payer: Medicare Other | Admitting: Physical Therapy

## 2017-09-11 ENCOUNTER — Other Ambulatory Visit (HOSPITAL_COMMUNITY): Payer: Medicare Other

## 2017-09-11 ENCOUNTER — Ambulatory Visit (HOSPITAL_COMMUNITY): Payer: Medicare Other

## 2017-09-23 ENCOUNTER — Other Ambulatory Visit: Payer: Self-pay | Admitting: *Deleted

## 2017-09-23 DIAGNOSIS — M5136 Other intervertebral disc degeneration, lumbar region: Secondary | ICD-10-CM

## 2017-09-23 MED ORDER — GABAPENTIN 300 MG PO CAPS: 300.0000 mg | ORAL_CAPSULE | Freq: Three times a day (TID) | ORAL | 0 refills | Status: DC

## 2017-09-25 ENCOUNTER — Other Ambulatory Visit: Payer: Self-pay | Admitting: *Deleted

## 2017-09-25 DIAGNOSIS — M5136 Other intervertebral disc degeneration, lumbar region: Secondary | ICD-10-CM

## 2017-09-26 MED ORDER — TRAMADOL HCL 50 MG PO TABS: ORAL_TABLET | ORAL | 0 refills | Status: DC

## 2017-10-15 ENCOUNTER — Ambulatory Visit (HOSPITAL_COMMUNITY): Payer: Medicare Other

## 2017-10-16 ENCOUNTER — Encounter: Payer: Self-pay | Admitting: Physical Therapy

## 2017-10-16 NOTE — Therapy (Signed)
[place], [place], [address] Phone: [phone]   Fax:  [phone]  Patient Details  Name: Rachel Murray MRN: [id number] Date of Birth: 31-Jan-1946 Referring Provider:  No ref. provider found  Encounter Date: 10/16/2017   PHYSICAL THERAPY DISCHARGE SUMMARY  Visits from Start of Care: 1  Current functional level related to goals / functional outcomes: Patient has not returned since last session, DC.    Remaining deficits: Unable to assess    Education / Equipment: Unable to assess  Plan: Patient agrees to discharge.  Patient goals were not met. Patient is being discharged due to not returning since the last visit.  ?????         Deniece Ree PT, DPT, CBIS  Supplemental Physical Therapist [place]   Pager [place] [facility] [address] [place], [place], [address] Phone: [phone]   Fax:  [phone]

## 2017-10-23 ENCOUNTER — Encounter (HOSPITAL_COMMUNITY): Payer: Self-pay

## 2017-10-23 ENCOUNTER — Ambulatory Visit (HOSPITAL_COMMUNITY)
Admission: RE | Admit: 2017-10-23 | Discharge: 2017-10-23 | Disposition: A | Discharge status: Home / Self Care | Payer: Medicare Other | Source: Ambulatory Visit | Attending: Internal Medicine | Admitting: Internal Medicine

## 2017-10-23 ENCOUNTER — Telehealth: Payer: Self-pay | Admitting: Internal Medicine

## 2017-10-23 DIAGNOSIS — G309 Alzheimer's disease, unspecified: Secondary | ICD-10-CM

## 2017-10-23 NOTE — Progress Notes (Signed)
Pt was unable to complete exam.  Discussed with pt's daughter and they will discuss with Dr to find out what the next step should be.

## 2017-10-23 NOTE — Telephone Encounter (Signed)
Changed MRV to MRI for dementia work-up.

## 2017-10-24 ENCOUNTER — Ambulatory Visit (INDEPENDENT_AMBULATORY_CARE_PROVIDER_SITE_OTHER): Payer: Medicare Other | Admitting: Neurology

## 2017-10-24 ENCOUNTER — Other Ambulatory Visit: Payer: Self-pay

## 2017-10-24 ENCOUNTER — Encounter: Payer: Self-pay | Admitting: Neurology

## 2017-10-24 VITALS — BP 106/64 | HR 87 | Ht 63.5 in | Wt 125.0 lb

## 2017-10-24 DIAGNOSIS — F0390 Unspecified dementia without behavioral disturbance: Secondary | ICD-10-CM | POA: Diagnosis not present

## 2017-10-24 DIAGNOSIS — F03A0 Unspecified dementia, mild, without behavioral disturbance, psychotic disturbance, mood disturbance, and anxiety: Secondary | ICD-10-CM

## 2017-10-24 MED ORDER — ESCITALOPRAM OXALATE 5 MG PO TABS: 5.0000 mg | ORAL_TABLET | Freq: Every day | ORAL | 11 refills | Status: AC

## 2017-10-24 MED FILL — ESCITALOPRAM 5 MG TABLET: 5 | 30 days supply | Qty: 30 | Fill #0

## 2017-10-24 NOTE — Patient Instructions (Addendum)
1. Schedule open MRI brain without contrast  We have sent a referral for your OPEN MRI to [facility].  They will contact you directly to schedule your appointment.  [facility] is located at [address], [place], [place] [address].  If you need to speak with [facility] for any reason, they can be reached at [phone]  2. Start Lexapro 5mg  daily 3. It is recommended that your son/daughter start checking to make sure you are not forgetting the medications 4. Follow-up in 6 months, call for any changes  FALL PRECAUTIONS: Be cautious when walking. Scan the area for obstacles that may increase the risk of trips and falls. When getting up in the mornings, sit up at the edge of the bed for a few minutes before getting out of bed. Consider elevating the bed at the head end to avoid drop of blood pressure when getting up. Walk always in a well-lit room (use night lights in the walls). Avoid area rugs or power cords from appliances in the middle of the walkways. Use a walker or a cane if necessary and consider physical therapy for balance exercise. Get your eyesight checked regularly.  FINANCIAL OVERSIGHT: Supervision, especially oversight when making financial decisions or transactions is also recommended.  HOME SAFETY: Consider the safety of the kitchen when operating appliances like stoves, microwave oven, and blender. Consider having supervision and share cooking responsibilities until no longer able to participate in those. Accidents with firearms and other hazards in the house should be identified and addressed as well.  DRIVING: Regarding driving, in patients with progressive memory problems, driving will be impaired. We advise to have someone else do the driving if trouble finding directions or if minor accidents are reported. Independent driving assessment is available to determine safety of driving.  ABILITY TO BE LEFT ALONE: If patient is unable to contact 911 operator, consider using  LifeLine, or when the need is there, arrange for someone to stay with patients. Smoking is a fire hazard, consider supervision or cessation. Risk of wandering should be assessed by caregiver and if detected at any point, supervision and safe proof recommendations should be instituted.  MEDICATION SUPERVISION: Inability to self-administer medication needs to be constantly addressed. Implement a mechanism to ensure safe administration of the medications.  RECOMMENDATIONS FOR ALL PATIENTS WITH MEMORY PROBLEMS: 1. Continue to exercise (Recommend 30 minutes of walking everyday, or 3 hours every week) 2. Increase social interactions - continue going to [place] and enjoy social gatherings with friends and family 3. Eat healthy, avoid fried foods and eat more fruits and vegetables 4. Maintain adequate blood pressure, blood sugar, and blood cholesterol level. Reducing the risk of stroke and cardiovascular disease also helps promoting better memory. 5. Avoid stressful situations. Live a simple life and avoid aggravations. Organize your time and prepare for the next day in anticipation. 6. Sleep well, avoid any interruptions of sleep and avoid any distractions in the bedroom that may interfere with adequate sleep quality 7. Avoid sugar, avoid sweets as there is a strong link between excessive sugar intake, diabetes, and cognitive impairment The Mediterranean diet has been shown to help patients reduce the risk of progressive memory disorders and reduces cardiovascular risk. This includes eating fish, eat fruits and green leafy vegetables, nuts like almonds and hazelnuts, walnuts, and also use olive oil. Avoid fast foods and fried foods as much as possible. Avoid sweets and sugar as sugar use has been linked to worsening of memory function.  There is always  a concern of gradual progression of memory problems. If this is the case, then we may need to adjust level of care according to patient needs. Support, both to  the patient and caregiver, should then be put into place.
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
## 2017-10-28 NOTE — Progress Notes (Signed)
Sent to PCP ?

## 2017-10-29 ENCOUNTER — Telehealth: Payer: Self-pay | Admitting: Neurology

## 2017-10-29 NOTE — Telephone Encounter (Signed)
Patient daughter called and states that [facility] does not have orders for the patient to have a MRI and they would like to get this done before patient leaves on Saturday

## 2017-10-29 NOTE — Telephone Encounter (Signed)
Orders re-faxed to [facility].

## 2017-10-31 ENCOUNTER — Other Ambulatory Visit: Payer: Self-pay | Admitting: *Deleted

## 2017-10-31 DIAGNOSIS — M5136 Other intervertebral disc degeneration, lumbar region: Secondary | ICD-10-CM

## 2017-10-31 MED ORDER — TRAMADOL HCL 50 MG PO TABS: ORAL_TABLET | ORAL | 0 refills | Status: DC

## 2017-10-31 MED FILL — traMADol HCL 50 MG TABS: 50 | 30 days supply | Qty: 90 | Fill #0

## 2017-10-31 NOTE — Telephone Encounter (Signed)
Pt's dtr stopped by the office to request refill on tramadol.  Pt has about 3 tabs left and will be going back out of town tomorrow to stay with another relative.  Will send to pcp for review, please advise.Despina Hidden Cassady5/31/20199:17 AM

## 2017-11-13 ENCOUNTER — Ambulatory Visit: Payer: Medicare Other

## 2017-12-18 ENCOUNTER — Other Ambulatory Visit: Payer: Self-pay | Admitting: *Deleted

## 2017-12-18 DIAGNOSIS — M5136 Other intervertebral disc degeneration, lumbar region: Secondary | ICD-10-CM

## 2017-12-22 MED ORDER — TRAMADOL HCL 50 MG PO TABS: ORAL_TABLET | ORAL | 2 refills | Status: DC

## 2017-12-22 NOTE — Telephone Encounter (Signed)
[place] narcotic database and chart reviewed.  Refill history appropriate.  Refilled with 2 refills.

## 2018-01-16 ENCOUNTER — Other Ambulatory Visit: Payer: Self-pay | Admitting: Internal Medicine

## 2018-01-16 DIAGNOSIS — M5136 Other intervertebral disc degeneration, lumbar region: Secondary | ICD-10-CM

## 2018-01-16 NOTE — Telephone Encounter (Signed)
Needs refill on gabapentin (NEURONTIN) 300 MG capsule @ [facility], pt contact# [phone].

## 2018-01-18 MED ORDER — GABAPENTIN 300 MG PO CAPS
300.0000 mg | ORAL_CAPSULE | Freq: Three times a day (TID) | ORAL | 0 refills | Status: DC
Start: 2018-01-18 — End: 2018-04-21

## 2018-01-19 MED FILL — GABAPENTIN 300 MG CAPSULE: 300 | 90 days supply | Qty: 270 | Fill #0

## 2018-04-16 ENCOUNTER — Other Ambulatory Visit: Payer: Self-pay | Admitting: Internal Medicine

## 2018-04-16 DIAGNOSIS — M5136 Other intervertebral disc degeneration, lumbar region: Secondary | ICD-10-CM

## 2018-04-16 NOTE — Telephone Encounter (Signed)
Refill Request   gabapentin (NEURONTIN) 300 MG capsule  [facility] [address] [facility] [place], [place] - [address]

## 2018-04-21 NOTE — Telephone Encounter (Signed)
Needs refills traMADol (ULTRAM) 50 MG tablet at [facility]  ;pt contact [phone]

## 2018-04-22 MED ORDER — GABAPENTIN 300 MG PO CAPS: 300.0000 mg | ORAL_CAPSULE | Freq: Three times a day (TID) | ORAL | 0 refills | Status: AC

## 2018-04-22 MED ORDER — TRAMADOL HCL 50 MG PO TABS: ORAL_TABLET | ORAL | 2 refills | Status: AC

## 2018-04-29 ENCOUNTER — Other Ambulatory Visit: Payer: Self-pay

## 2018-04-29 NOTE — Telephone Encounter (Signed)
Called pharmacy, spoke w/ pharmacist, he states insurance will not cover for 3 more days, he will call and explain to pt's son

## 2018-04-29 NOTE — Telephone Encounter (Signed)
Requesting to speak with a nurse about getting tramadol to be filled. Please call back.

## 2018-06-05 ENCOUNTER — Ambulatory Visit: Payer: Medicare Other | Admitting: Neurology

## 2021-02-01 DEATH — deceased
